# Patient Record
Sex: Female | Born: 1980 | Race: White | Hispanic: No | Smoking: Never smoker
Health system: Southern US, Community
[De-identification: ages and names within clinical notes are randomized; demographics above are authoritative.]

## PROBLEM LIST (undated history)

## (undated) DIAGNOSIS — K219 Gastro-esophageal reflux disease without esophagitis: Secondary | ICD-10-CM

## (undated) HISTORY — DX: Gastro-esophageal reflux disease without esophagitis: K21.9

---

## 2015-01-14 ENCOUNTER — Encounter: Payer: Self-pay | Admitting: Podiatry

## 2015-01-14 ENCOUNTER — Ambulatory Visit (INDEPENDENT_AMBULATORY_CARE_PROVIDER_SITE_OTHER): Payer: BLUE CROSS/BLUE SHIELD | Admitting: Podiatry

## 2015-01-14 ENCOUNTER — Ambulatory Visit (INDEPENDENT_AMBULATORY_CARE_PROVIDER_SITE_OTHER): Payer: BLUE CROSS/BLUE SHIELD

## 2015-01-14 VITALS — BP 101/61 | HR 74 | Resp 18

## 2015-01-14 DIAGNOSIS — M779 Enthesopathy, unspecified: Secondary | ICD-10-CM

## 2015-01-14 DIAGNOSIS — M7740 Metatarsalgia, unspecified foot: Secondary | ICD-10-CM

## 2015-01-14 DIAGNOSIS — M79673 Pain in unspecified foot: Secondary | ICD-10-CM

## 2015-01-14 NOTE — Progress Notes (Signed)
   Subjective:    Patient ID: Nicole Todd, female    DOB: Oct 16, 1980, 34 y.o.   MRN: [id number]  HPI  Patient presents the office today with complaints of bilateral second toe numbness and pain in the ball of her foot underlying second toe which has been ongoing for several months. She states that her mom has neuromas and she is concerned that she may be getting 1. She denies any history of injury or trauma. She denies any swelling or any redness. The pain does not wake up at night. Pain is worse in the morning. She said no prior treatment. No other complaints at this time.  Review of Systems  Neurological: Positive for headaches.  All other systems reviewed and are negative.      Objective:   Physical Exam AAO x3, NAD DP/PT pulses palpable bilaterally, CRT less than 3 seconds Protective sensation intact with Simms Weinstein monofilament, vibratory sensation intact, Achilles tendon reflex intact There is mild tenderness on Submetatarsal 2 bilaterally and there is a small hyperkeratotic lesion overlying submetatarsal 2. There is no pinpoint bony tenderness or pain the vibratory sensation. There is no tenderness along the second digit. There is no palpable neuroma identified in the interspaces and there is no reproduction of numbness or tingling upon palpation of the interspaces. There is no gross subluxation of the second digit compared to the contralateral extremities. No areas of tenderness to bilateral lower extremities. MMT 5/5, ROM WNL.  No open lesions or pre-ulcerative lesions.  No overlying edema, erythema, increase in warmth to bilateral lower extremities.  No pain with calf compression, swelling, warmth, erythema bilaterally.      Assessment & Plan:  Bilateral submetatarsal to pain, capsulitis, metatarsalgia -Treatment options discussed including all alternatives, risks, and complications -X-rays were obtained and reviewed with the patient.  -Likely etiology of her symptoms were  discussed -Offloading pads dispensed -discussed shoe gear modifications -Anti-inflammatories needed -Discussed possible orthotics. -Follow-up if symptoms are not resolved in 4-6 weeks or sooner if any problems arise. In the meantime, encouraged to call the office with any questions, concerns, change in symptoms.   Celesta Gentile, DPM

## 2015-02-14 ENCOUNTER — Encounter: Payer: Self-pay | Admitting: Podiatry

## 2015-02-14 ENCOUNTER — Ambulatory Visit (INDEPENDENT_AMBULATORY_CARE_PROVIDER_SITE_OTHER): Payer: BLUE CROSS/BLUE SHIELD | Admitting: Podiatry

## 2015-02-14 VITALS — BP 97/65 | HR 74 | Resp 18

## 2015-02-14 DIAGNOSIS — D3610 Benign neoplasm of peripheral nerves and autonomic nervous system, unspecified: Secondary | ICD-10-CM | POA: Diagnosis not present

## 2015-02-14 DIAGNOSIS — M7740 Metatarsalgia, unspecified foot: Secondary | ICD-10-CM | POA: Diagnosis not present

## 2015-02-14 DIAGNOSIS — M779 Enthesopathy, unspecified: Secondary | ICD-10-CM

## 2015-02-15 NOTE — Progress Notes (Signed)
Patient ID: Nicole Todd, female   DOB: [birth year], 33 y.o.   MRN: [id number]  Subjective: 34 year old female presents the office today follow-up evaluation of bilateral second toe pain as well as numbness of the second toe. She also states that she does get some occasional numbness the third toe as well bilaterally. She states the pads were dispensed possibly.help much but she did purchase new shoes in some other patches seem to help. Denies any swelling or redness. No recent injury or trauma. She says the pain is intermittent and not on the daily occurrence. No other complaints at this time.  Objective: AAO 3, NAD Neurovascular status intact and unchanged there is  Mild hammertoe contracture bilateral second digits. There is mild discomfort upon the second interspace bilaterally have there is no palpable neuroma identified. There is no pain with meals lateral compression of metatarsals. There is no area pinpoint bony tenderness or pain the vibratory sensation. MPJ range of motion is intact. MMT 5/5, ROM WNL. No open lesions or pre-ulcer lesions identified bilaterally. There is no pain with calf compression, swelling, warmth, erythema.  Assessment: 34 year old female with continued second toe pain bilaterally likely capsulitis versus possible neuroma  Plan: -Treatment options discussed including all alternatives, risks, and complications -I discussed steroid injection however patient wishes to hold off on that at this time. -Recommended anti-inflammatories. She said that she will tried over-the-counter before proceeding with prescription.  -Continue with offloading pads as well as supportive shoe gear. -Follow-up as needed. Call the office with any questions, concerns, changes symptoms.  Celesta Gentile, DPM

## 2016-05-04 DIAGNOSIS — Z6826 Body mass index (BMI) 26.0-26.9, adult: Secondary | ICD-10-CM | POA: Diagnosis not present

## 2016-05-04 DIAGNOSIS — Z01419 Encounter for gynecological examination (general) (routine) without abnormal findings: Secondary | ICD-10-CM | POA: Diagnosis not present

## 2016-05-11 DIAGNOSIS — J069 Acute upper respiratory infection, unspecified: Secondary | ICD-10-CM | POA: Diagnosis not present

## 2016-08-08 ENCOUNTER — Ambulatory Visit (INDEPENDENT_AMBULATORY_CARE_PROVIDER_SITE_OTHER): Payer: BLUE CROSS/BLUE SHIELD | Admitting: Podiatry

## 2016-08-08 DIAGNOSIS — D3610 Benign neoplasm of peripheral nerves and autonomic nervous system, unspecified: Secondary | ICD-10-CM | POA: Diagnosis not present

## 2016-08-08 DIAGNOSIS — M7740 Metatarsalgia, unspecified foot: Secondary | ICD-10-CM | POA: Diagnosis not present

## 2016-08-08 DIAGNOSIS — L853 Xerosis cutis: Secondary | ICD-10-CM

## 2016-08-13 ENCOUNTER — Telehealth: Payer: Self-pay | Admitting: *Deleted

## 2016-08-13 DIAGNOSIS — D3610 Benign neoplasm of peripheral nerves and autonomic nervous system, unspecified: Secondary | ICD-10-CM

## 2016-08-13 NOTE — Telephone Encounter (Addendum)
-----   Message from Trula Slade, DPM sent at 08/08/2016  4:07 PM EDT ----- Can you please order an ultrasound on the left foot on the 2nd and 3rd interspace to rule out neuroma. 08/13/2016-Faxed orders to [facility].

## 2016-08-13 NOTE — Progress Notes (Signed)
Subjective: 36 year old female presents the office today as her second toes continue to rub her big toe and she had developed a small callus to the side of the second toe. She also states that her skin is dry to the bottom of feet. She does continue to get some mild pain in between her toes mostly on the second and third interspaces. She states this is intermittent in nature but since been ongoing for some time. Denies any systemic complaints such as fevers, chills, nausea, vomiting. No acute changes since last appointment, and no other complaints at this time.   Objective: AAO x3, NAD DP/PT pulses palpable bilaterally, CRT less than 3 seconds Dry skin is presently but there is no fissuring or opening on the skin lesions. There is mild tenderness to the left foot on the second third interspaces. There does appear to be a palpable neuroma third interspace. She denies numbness and tingling to the toes today. There is no overlying swelling, erythema. This is chronic at this point. There is minimal hyperkeratotic tissue present along the distal interphalangeal joint medially on the second toe on the left side worse than right. There is no edema, erythema, increase in warmth. No open lesions or pre-ulcerative lesions.  No pain with calf compression, swelling, warmth, erythema  Assessment: Rule out neuroma left foot second third interspace, dry skin; warm  Plan: -All treatment options discussed with the patient including all alternatives, risks, complications.  -At this time given the chronicity of her symptoms and we'll order an ultrasound left foot to evaluate for neuroma to the second third interspaces. This is ordered today. -Discussed urea cream for dry skin.  -Offloading pads for the hammertoes. -A more definitive plan pending ultrasound results. Discussed injection therapy, orthotics, surgery. -RTC after ultrasound or sooner if needed.  -Patient encouraged to call the office with any questions,  concerns, change in symptoms.   Celesta Gentile, DPM

## 2016-08-17 DIAGNOSIS — J028 Acute pharyngitis due to other specified organisms: Secondary | ICD-10-CM | POA: Diagnosis not present

## 2016-08-21 DIAGNOSIS — L700 Acne vulgaris: Secondary | ICD-10-CM | POA: Diagnosis not present

## 2016-08-21 DIAGNOSIS — D225 Melanocytic nevi of trunk: Secondary | ICD-10-CM | POA: Diagnosis not present

## 2016-08-30 ENCOUNTER — Other Ambulatory Visit: Payer: Self-pay

## 2016-08-31 ENCOUNTER — Ambulatory Visit
Admission: RE | Admit: 2016-08-31 | Discharge: 2016-08-31 | Disposition: A | Discharge status: Home / Self Care | Payer: BLUE CROSS/BLUE SHIELD | Source: Ambulatory Visit | Attending: Podiatry | Admitting: Podiatry

## 2016-08-31 DIAGNOSIS — Z13220 Encounter for screening for lipoid disorders: Secondary | ICD-10-CM | POA: Diagnosis not present

## 2016-08-31 DIAGNOSIS — K219 Gastro-esophageal reflux disease without esophagitis: Secondary | ICD-10-CM | POA: Diagnosis not present

## 2016-08-31 DIAGNOSIS — Z131 Encounter for screening for diabetes mellitus: Secondary | ICD-10-CM | POA: Diagnosis not present

## 2016-08-31 DIAGNOSIS — M79672 Pain in left foot: Secondary | ICD-10-CM | POA: Diagnosis not present

## 2016-08-31 DIAGNOSIS — F900 Attention-deficit hyperactivity disorder, predominantly inattentive type: Secondary | ICD-10-CM | POA: Diagnosis not present

## 2016-08-31 DIAGNOSIS — B001 Herpesviral vesicular dermatitis: Secondary | ICD-10-CM | POA: Diagnosis not present

## 2016-09-13 ENCOUNTER — Ambulatory Visit (INDEPENDENT_AMBULATORY_CARE_PROVIDER_SITE_OTHER): Payer: BLUE CROSS/BLUE SHIELD | Admitting: Podiatry

## 2016-09-13 DIAGNOSIS — D3610 Benign neoplasm of peripheral nerves and autonomic nervous system, unspecified: Secondary | ICD-10-CM | POA: Diagnosis not present

## 2016-09-13 DIAGNOSIS — M7740 Metatarsalgia, unspecified foot: Secondary | ICD-10-CM | POA: Diagnosis not present

## 2016-09-13 NOTE — Progress Notes (Signed)
Subjective: 36 year old female presents the office to discuss ultrasound results. She states the pain to her left foot is doing better and she'll has intermittent pain and not consistent on a daily basis. She states that over the last several months is actually improved. She does get occasional numbness and tingling to the toes. Denies any systemic complaints such as fevers, chills, nausea, vomiting. No acute changes since last appointment, and no other complaints at this time.   Objective: AAO x3, NAD DP/PT pulses palpable bilaterally, CRT less than 3 seconds Mild to palpation second interspace as well as the third interspace and the left foot there is no significant palpable neuroma identified at this time there is no pain to palpation. There is no area pinpoint bony tenderness. Vibratory sensation. There is no overlying edema, erythema, increased warmth. There is prominence the metatarsal heads cleanly with atrophy of the fat pad.  No open lesions or pre-ulcerative lesions.  No pain with calf compression, swelling, warmth, erythema  Assessment: Neuroma left foot  Plan: -All treatment options discussed with the patient including all alternatives, risks, complications.  -Ultrasound results were discussed the patient. Discussed treatment options. We discussed surgical insert treatment. She does not desire surgery. Discussed with her steroid injection, dehydrated alcohol injections, physical therapy, orthotics. She wishes to hold off on any of these treatments. She does not want to do an injection and she will not wear an orthotic. -Patient encouraged to call the office with any questions, concerns, change in symptoms.   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  Celesta Gentile, DPM

## 2016-09-13 NOTE — Patient Instructions (Signed)
Morton Neuralgia Morton neuralgia is a type of foot pain in the area closest to your toes. This area is sometimes called the ball of your foot. Morton neuralgia occurs when a branch of a nerve in your foot (digital nerve) becomes compressed. When this happens over a long period of time, the nerve can thicken (neuroma) and cause pain. This usually occurs between the third and fourth toe. Morton neuralgia can come and go but may get worse over time. What are the causes? Your digital nerve can become compressed and stretched at a point where it passes under a thick band of tissue that connects your toes (intermetatarsal ligament). Morton neuralgia can be caused by mild repetitive damage in this area. This type of damage can result from:  Activities such as running or jumping.  Wearing shoes that are too tight. What increases the risk? You may be at risk for Morton neuralgia if you:  Are female.  Wear high heels.  Wear shoes that are narrow or tight.  Participate in activities that stretch your toes. These include:  Running.  Ballet.  Long-distance walking. What are the signs or symptoms? The first symptom of Morton neuralgia is pain that spreads from the ball of your foot to your toes. It may feel like you are walking on a marble. Pain usually gets worse with walking and goes away at night. Other symptoms may include numbness and cramping of your toes. How is this diagnosed? Your health care provider will do a physical exam. When doing the exam, your health care provider may:  Squeeze your foot just behind your toe.  Ask you to move your toes to check for pain. You may also have tests on your foot to confirm the diagnosis. These may include:  An X-ray.  An MRI. How is this treated? Treatment for Morton neuralgia may be as simple as changing the kind of shoes you wear. Other treatments may include:  Wearing a supportive pad (orthosis) under the front of your foot. This lifts your  toe bones and takes pressure off the nerve.  Getting injections of numbing medicine and anti-inflammatory medicine (steroid) in the nerve.  Having surgery to remove part of the thickened nerve. Follow these instructions at home:  Take medicine only as directed by your health care provider.  Wear soft-soled shoes with a wide toe area.  Stop activities that may be causing pain.  Elevate your foot when resting.  Massage your foot.  Apply ice to the injured area:  Put ice in a plastic bag.  Place a towel between your skin and the bag.  Leave the ice on for 20 minutes, 2-3 times a day.  Keep all follow-up visits as directed by your health care provider. This is important. Contact a health care provider if:  Home care instructions are not helping you get better.  Your symptoms change or get worse. This information is not intended to replace advice given to you by your health care provider. Make sure you discuss any questions you have with your health care provider. Document Released: 08/13/2000 Document Revised: 10/13/2015 Document Reviewed: 07/08/2013 Elsevier Interactive Patient Education  2017 [employer].

## 2016-09-21 DIAGNOSIS — D2211 Melanocytic nevi of right eyelid, including canthus: Secondary | ICD-10-CM | POA: Diagnosis not present

## 2016-10-10 DIAGNOSIS — Z3009 Encounter for other general counseling and advice on contraception: Secondary | ICD-10-CM | POA: Diagnosis not present

## 2016-10-18 DIAGNOSIS — L2089 Other atopic dermatitis: Secondary | ICD-10-CM | POA: Diagnosis not present

## 2016-10-18 DIAGNOSIS — L700 Acne vulgaris: Secondary | ICD-10-CM | POA: Diagnosis not present

## 2016-10-23 DIAGNOSIS — D2262 Melanocytic nevi of left upper limb, including shoulder: Secondary | ICD-10-CM | POA: Diagnosis not present

## 2016-10-23 DIAGNOSIS — D225 Melanocytic nevi of trunk: Secondary | ICD-10-CM | POA: Diagnosis not present

## 2016-10-23 DIAGNOSIS — L853 Xerosis cutis: Secondary | ICD-10-CM | POA: Diagnosis not present

## 2016-10-23 DIAGNOSIS — D2261 Melanocytic nevi of right upper limb, including shoulder: Secondary | ICD-10-CM | POA: Diagnosis not present

## 2016-10-23 DIAGNOSIS — D485 Neoplasm of uncertain behavior of skin: Secondary | ICD-10-CM | POA: Diagnosis not present

## 2016-10-23 DIAGNOSIS — L2089 Other atopic dermatitis: Secondary | ICD-10-CM | POA: Diagnosis not present

## 2016-12-13 DIAGNOSIS — Z113 Encounter for screening for infections with a predominantly sexual mode of transmission: Secondary | ICD-10-CM | POA: Diagnosis not present

## 2016-12-17 DIAGNOSIS — Z1159 Encounter for screening for other viral diseases: Secondary | ICD-10-CM | POA: Diagnosis not present

## 2016-12-17 DIAGNOSIS — Z113 Encounter for screening for infections with a predominantly sexual mode of transmission: Secondary | ICD-10-CM | POA: Diagnosis not present

## 2016-12-17 DIAGNOSIS — Z114 Encounter for screening for human immunodeficiency virus [HIV]: Secondary | ICD-10-CM | POA: Diagnosis not present

## 2016-12-17 DIAGNOSIS — N882 Stricture and stenosis of cervix uteri: Secondary | ICD-10-CM | POA: Diagnosis not present

## 2016-12-17 DIAGNOSIS — Z3202 Encounter for pregnancy test, result negative: Secondary | ICD-10-CM | POA: Diagnosis not present

## 2016-12-17 DIAGNOSIS — Z30430 Encounter for insertion of intrauterine contraceptive device: Secondary | ICD-10-CM | POA: Diagnosis not present

## 2017-01-17 DIAGNOSIS — Z30431 Encounter for routine checking of intrauterine contraceptive device: Secondary | ICD-10-CM | POA: Diagnosis not present

## 2017-03-01 DIAGNOSIS — F900 Attention-deficit hyperactivity disorder, predominantly inattentive type: Secondary | ICD-10-CM | POA: Diagnosis not present

## 2017-03-01 DIAGNOSIS — B001 Herpesviral vesicular dermatitis: Secondary | ICD-10-CM | POA: Diagnosis not present

## 2017-03-01 DIAGNOSIS — K219 Gastro-esophageal reflux disease without esophagitis: Secondary | ICD-10-CM | POA: Diagnosis not present

## 2017-04-17 DIAGNOSIS — J029 Acute pharyngitis, unspecified: Secondary | ICD-10-CM | POA: Diagnosis not present

## 2017-05-10 DIAGNOSIS — Z01419 Encounter for gynecological examination (general) (routine) without abnormal findings: Secondary | ICD-10-CM | POA: Diagnosis not present

## 2017-05-10 DIAGNOSIS — Z1151 Encounter for screening for human papillomavirus (HPV): Secondary | ICD-10-CM | POA: Diagnosis not present

## 2017-05-10 DIAGNOSIS — N939 Abnormal uterine and vaginal bleeding, unspecified: Secondary | ICD-10-CM | POA: Diagnosis not present

## 2017-05-10 DIAGNOSIS — Z6824 Body mass index (BMI) 24.0-24.9, adult: Secondary | ICD-10-CM | POA: Diagnosis not present

## 2017-07-11 DIAGNOSIS — N939 Abnormal uterine and vaginal bleeding, unspecified: Secondary | ICD-10-CM | POA: Diagnosis not present

## 2017-08-24 DIAGNOSIS — R300 Dysuria: Secondary | ICD-10-CM | POA: Diagnosis not present

## 2017-08-24 DIAGNOSIS — R1084 Generalized abdominal pain: Secondary | ICD-10-CM | POA: Diagnosis not present

## 2017-08-24 DIAGNOSIS — N911 Secondary amenorrhea: Secondary | ICD-10-CM | POA: Diagnosis not present

## 2017-08-24 DIAGNOSIS — N390 Urinary tract infection, site not specified: Secondary | ICD-10-CM | POA: Diagnosis not present

## 2017-08-24 DIAGNOSIS — R82998 Other abnormal findings in urine: Secondary | ICD-10-CM | POA: Diagnosis not present

## 2017-09-04 DIAGNOSIS — B001 Herpesviral vesicular dermatitis: Secondary | ICD-10-CM | POA: Diagnosis not present

## 2017-09-04 DIAGNOSIS — F900 Attention-deficit hyperactivity disorder, predominantly inattentive type: Secondary | ICD-10-CM | POA: Diagnosis not present

## 2017-09-04 DIAGNOSIS — K219 Gastro-esophageal reflux disease without esophagitis: Secondary | ICD-10-CM | POA: Diagnosis not present

## 2017-09-04 DIAGNOSIS — L309 Dermatitis, unspecified: Secondary | ICD-10-CM | POA: Diagnosis not present

## 2017-09-05 DIAGNOSIS — R1084 Generalized abdominal pain: Secondary | ICD-10-CM | POA: Diagnosis not present

## 2017-09-05 DIAGNOSIS — R3129 Other microscopic hematuria: Secondary | ICD-10-CM | POA: Diagnosis not present

## 2017-09-10 DIAGNOSIS — R300 Dysuria: Secondary | ICD-10-CM | POA: Diagnosis not present

## 2017-09-10 DIAGNOSIS — R109 Unspecified abdominal pain: Secondary | ICD-10-CM | POA: Diagnosis not present

## 2017-09-10 DIAGNOSIS — R3129 Other microscopic hematuria: Secondary | ICD-10-CM | POA: Diagnosis not present

## 2017-09-16 DIAGNOSIS — D22111 Melanocytic nevi of right upper eyelid, including canthus: Secondary | ICD-10-CM | POA: Diagnosis not present

## 2017-10-29 DIAGNOSIS — N911 Secondary amenorrhea: Secondary | ICD-10-CM | POA: Diagnosis not present

## 2017-10-29 DIAGNOSIS — R3129 Other microscopic hematuria: Secondary | ICD-10-CM | POA: Diagnosis not present

## 2017-10-29 DIAGNOSIS — R300 Dysuria: Secondary | ICD-10-CM | POA: Diagnosis not present

## 2017-12-06 DIAGNOSIS — Z1159 Encounter for screening for other viral diseases: Secondary | ICD-10-CM | POA: Diagnosis not present

## 2017-12-06 DIAGNOSIS — B373 Candidiasis of vulva and vagina: Secondary | ICD-10-CM | POA: Diagnosis not present

## 2017-12-06 DIAGNOSIS — L292 Pruritus vulvae: Secondary | ICD-10-CM | POA: Diagnosis not present

## 2017-12-06 DIAGNOSIS — Z114 Encounter for screening for human immunodeficiency virus [HIV]: Secondary | ICD-10-CM | POA: Diagnosis not present

## 2017-12-06 DIAGNOSIS — N898 Other specified noninflammatory disorders of vagina: Secondary | ICD-10-CM | POA: Diagnosis not present

## 2017-12-06 DIAGNOSIS — Z113 Encounter for screening for infections with a predominantly sexual mode of transmission: Secondary | ICD-10-CM | POA: Diagnosis not present

## 2017-12-06 DIAGNOSIS — Z118 Encounter for screening for other infectious and parasitic diseases: Secondary | ICD-10-CM | POA: Diagnosis not present

## 2018-03-07 DIAGNOSIS — K219 Gastro-esophageal reflux disease without esophagitis: Secondary | ICD-10-CM | POA: Diagnosis not present

## 2018-03-07 DIAGNOSIS — B001 Herpesviral vesicular dermatitis: Secondary | ICD-10-CM | POA: Diagnosis not present

## 2018-03-07 DIAGNOSIS — F900 Attention-deficit hyperactivity disorder, predominantly inattentive type: Secondary | ICD-10-CM | POA: Diagnosis not present

## 2018-04-30 DIAGNOSIS — Z1231 Encounter for screening mammogram for malignant neoplasm of breast: Secondary | ICD-10-CM | POA: Diagnosis not present

## 2018-05-27 DIAGNOSIS — Z130 Encounter for screening for diseases of the blood and blood-forming organs and certain disorders involving the immune mechanism: Secondary | ICD-10-CM | POA: Diagnosis not present

## 2018-05-27 DIAGNOSIS — Z1329 Encounter for screening for other suspected endocrine disorder: Secondary | ICD-10-CM | POA: Diagnosis not present

## 2018-05-27 DIAGNOSIS — Z6825 Body mass index (BMI) 25.0-25.9, adult: Secondary | ICD-10-CM | POA: Diagnosis not present

## 2018-05-27 DIAGNOSIS — Z118 Encounter for screening for other infectious and parasitic diseases: Secondary | ICD-10-CM | POA: Diagnosis not present

## 2018-05-27 DIAGNOSIS — Z113 Encounter for screening for infections with a predominantly sexual mode of transmission: Secondary | ICD-10-CM | POA: Diagnosis not present

## 2018-05-27 DIAGNOSIS — Z0000 Encounter for general adult medical examination without abnormal findings: Secondary | ICD-10-CM | POA: Diagnosis not present

## 2018-05-27 DIAGNOSIS — Z1159 Encounter for screening for other viral diseases: Secondary | ICD-10-CM | POA: Diagnosis not present

## 2018-05-27 DIAGNOSIS — Z114 Encounter for screening for human immunodeficiency virus [HIV]: Secondary | ICD-10-CM | POA: Diagnosis not present

## 2018-05-27 DIAGNOSIS — Z01419 Encounter for gynecological examination (general) (routine) without abnormal findings: Secondary | ICD-10-CM | POA: Diagnosis not present

## 2018-06-17 DIAGNOSIS — H52202 Unspecified astigmatism, left eye: Secondary | ICD-10-CM | POA: Diagnosis not present

## 2018-06-17 DIAGNOSIS — D22111 Melanocytic nevi of right upper eyelid, including canthus: Secondary | ICD-10-CM | POA: Diagnosis not present

## 2018-07-01 DIAGNOSIS — J069 Acute upper respiratory infection, unspecified: Secondary | ICD-10-CM | POA: Diagnosis not present

## 2018-07-01 DIAGNOSIS — J101 Influenza due to other identified influenza virus with other respiratory manifestations: Secondary | ICD-10-CM | POA: Diagnosis not present

## 2018-07-14 DIAGNOSIS — L700 Acne vulgaris: Secondary | ICD-10-CM | POA: Diagnosis not present

## 2018-09-08 DIAGNOSIS — F900 Attention-deficit hyperactivity disorder, predominantly inattentive type: Secondary | ICD-10-CM | POA: Diagnosis not present

## 2018-09-08 DIAGNOSIS — K219 Gastro-esophageal reflux disease without esophagitis: Secondary | ICD-10-CM | POA: Diagnosis not present

## 2018-09-08 DIAGNOSIS — B001 Herpesviral vesicular dermatitis: Secondary | ICD-10-CM | POA: Diagnosis not present

## 2018-09-26 DIAGNOSIS — J069 Acute upper respiratory infection, unspecified: Secondary | ICD-10-CM | POA: Diagnosis not present

## 2018-11-12 DIAGNOSIS — L700 Acne vulgaris: Secondary | ICD-10-CM | POA: Diagnosis not present

## 2018-11-12 DIAGNOSIS — Z79899 Other long term (current) drug therapy: Secondary | ICD-10-CM | POA: Diagnosis not present

## 2019-01-06 ENCOUNTER — Other Ambulatory Visit: Payer: Self-pay | Admitting: Family Medicine

## 2019-01-06 DIAGNOSIS — R109 Unspecified abdominal pain: Secondary | ICD-10-CM | POA: Diagnosis not present

## 2019-01-09 ENCOUNTER — Other Ambulatory Visit: Payer: Self-pay | Admitting: Family Medicine

## 2019-01-09 DIAGNOSIS — R1030 Lower abdominal pain, unspecified: Secondary | ICD-10-CM

## 2019-01-12 ENCOUNTER — Ambulatory Visit
Admission: RE | Admit: 2019-01-12 | Discharge: 2019-01-12 | Disposition: A | Discharge status: Home / Self Care | Payer: BC Managed Care – PPO | Source: Ambulatory Visit | Attending: Family Medicine | Admitting: Family Medicine

## 2019-01-12 DIAGNOSIS — R1010 Upper abdominal pain, unspecified: Secondary | ICD-10-CM | POA: Diagnosis not present

## 2019-01-12 DIAGNOSIS — R109 Unspecified abdominal pain: Secondary | ICD-10-CM

## 2019-01-13 ENCOUNTER — Ambulatory Visit
Admission: RE | Admit: 2019-01-13 | Discharge: 2019-01-13 | Disposition: A | Discharge status: Home / Self Care | Payer: BC Managed Care – PPO | Source: Ambulatory Visit | Attending: Family Medicine | Admitting: Family Medicine

## 2019-01-13 DIAGNOSIS — R1030 Lower abdominal pain, unspecified: Secondary | ICD-10-CM

## 2019-03-11 DIAGNOSIS — Z23 Encounter for immunization: Secondary | ICD-10-CM | POA: Diagnosis not present

## 2019-03-11 DIAGNOSIS — K219 Gastro-esophageal reflux disease without esophagitis: Secondary | ICD-10-CM | POA: Diagnosis not present

## 2019-03-11 DIAGNOSIS — B001 Herpesviral vesicular dermatitis: Secondary | ICD-10-CM | POA: Diagnosis not present

## 2019-03-11 DIAGNOSIS — F900 Attention-deficit hyperactivity disorder, predominantly inattentive type: Secondary | ICD-10-CM | POA: Diagnosis not present

## 2019-03-25 DIAGNOSIS — L700 Acne vulgaris: Secondary | ICD-10-CM | POA: Diagnosis not present

## 2019-04-10 ENCOUNTER — Other Ambulatory Visit: Payer: Self-pay

## 2019-04-10 DIAGNOSIS — Z20822 Contact with and (suspected) exposure to covid-19: Secondary | ICD-10-CM

## 2019-04-13 LAB — NOVEL CORONAVIRUS, NAA: SARS-CoV-2, NAA: NOT DETECTED

## 2019-06-02 DIAGNOSIS — Z01419 Encounter for gynecological examination (general) (routine) without abnormal findings: Secondary | ICD-10-CM | POA: Diagnosis not present

## 2019-06-02 DIAGNOSIS — Z6826 Body mass index (BMI) 26.0-26.9, adult: Secondary | ICD-10-CM | POA: Diagnosis not present

## 2019-07-01 DIAGNOSIS — D22111 Melanocytic nevi of right upper eyelid, including canthus: Secondary | ICD-10-CM | POA: Diagnosis not present

## 2019-07-23 DIAGNOSIS — L821 Other seborrheic keratosis: Secondary | ICD-10-CM | POA: Diagnosis not present

## 2019-07-23 DIAGNOSIS — D2239 Melanocytic nevi of other parts of face: Secondary | ICD-10-CM | POA: Diagnosis not present

## 2019-07-24 ENCOUNTER — Ambulatory Visit: Payer: BC Managed Care – PPO

## 2019-07-30 ENCOUNTER — Ambulatory Visit: Payer: BC Managed Care – PPO | Attending: Internal Medicine

## 2019-07-30 DIAGNOSIS — Z23 Encounter for immunization: Secondary | ICD-10-CM

## 2019-07-30 NOTE — Progress Notes (Signed)
   Covid-19 Vaccination Clinic  Name:  Abisola Pfeffer    MRN: [id number] DOB: Oct 14, 1980  07/30/2019  Ms. Sobolewski was observed post Covid-19 immunization for 15 minutes without incident. She was provided with Vaccine Information Sheet and instruction to access the V-Safe system.   Ms. Freeberg was instructed to call 911 with any severe reactions post vaccine: Marland Kitchen Difficulty breathing  . Swelling of face and throat  . A fast heartbeat  . A bad rash all over body  . Dizziness and weakness   Immunizations Administered    Name Date Dose VIS Date Route   Pfizer COVID-19 Vaccine 07/30/2019  9:57 AM 0.3 mL 05/01/2019 Intramuscular   Manufacturer: [place]   Lot: [id number]   [place]: [id number]

## 2019-08-24 ENCOUNTER — Ambulatory Visit: Payer: BC Managed Care – PPO | Attending: Internal Medicine

## 2019-08-24 DIAGNOSIS — Z23 Encounter for immunization: Secondary | ICD-10-CM

## 2019-08-24 NOTE — Progress Notes (Signed)
   Covid-19 Vaccination Clinic  Name:  Nicole Todd    MRN: [id number] DOB: Jan 12, 1981  08/24/2019  [name] was observed post Covid-19 immunization for 15 minutes without incident. She was provided with Vaccine Information Sheet and instruction to access the V-Safe system.   [name] was instructed to call 911 with any severe reactions post vaccine: Marland Kitchen Difficulty breathing  . Swelling of face and throat  . A fast heartbeat  . A bad rash all over body  . Dizziness and weakness   Immunizations Administered    Name Date Dose VIS Date Route   Pfizer COVID-19 Vaccine 08/24/2019  5:09 PM 0.3 mL 05/01/2019 Intramuscular   Manufacturer: [place]   Lot: [id number]   [place]: [id number]

## 2019-09-15 DIAGNOSIS — B001 Herpesviral vesicular dermatitis: Secondary | ICD-10-CM | POA: Diagnosis not present

## 2019-09-15 DIAGNOSIS — K219 Gastro-esophageal reflux disease without esophagitis: Secondary | ICD-10-CM | POA: Diagnosis not present

## 2019-09-15 DIAGNOSIS — F900 Attention-deficit hyperactivity disorder, predominantly inattentive type: Secondary | ICD-10-CM | POA: Diagnosis not present

## 2019-09-15 DIAGNOSIS — R635 Abnormal weight gain: Secondary | ICD-10-CM | POA: Diagnosis not present

## 2020-03-29 DIAGNOSIS — F900 Attention-deficit hyperactivity disorder, predominantly inattentive type: Secondary | ICD-10-CM | POA: Diagnosis not present

## 2020-03-29 DIAGNOSIS — B001 Herpesviral vesicular dermatitis: Secondary | ICD-10-CM | POA: Diagnosis not present

## 2020-03-29 DIAGNOSIS — K219 Gastro-esophageal reflux disease without esophagitis: Secondary | ICD-10-CM | POA: Diagnosis not present

## 2020-03-29 DIAGNOSIS — F439 Reaction to severe stress, unspecified: Secondary | ICD-10-CM | POA: Diagnosis not present

## 2020-04-06 DIAGNOSIS — Z1231 Encounter for screening mammogram for malignant neoplasm of breast: Secondary | ICD-10-CM | POA: Diagnosis not present

## 2020-05-18 IMAGING — US US PELVIS COMPLETE
1 series · 14 of 25 positions shown · non-contrast
Comparison: None

CLINICAL DATA: Lower abdominal pain of unknown etiology, IUD

EXAM:
TRANSABDOMINAL ULTRASOUND OF PELVIS
TECHNIQUE: Transabdominal ultrasound examination of the pelvis was performed
including evaluation of the uterus, ovaries, adnexal regions, and
pelvic cul-de-sac.

[Series 1: us pelvis complete · 0.20mm/px · 14 of 40 slices shown]
[im 1/40]
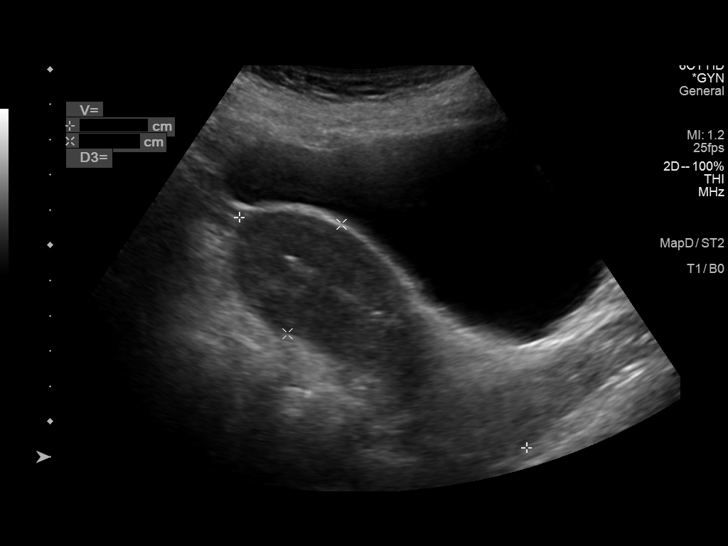
[im 4/40]
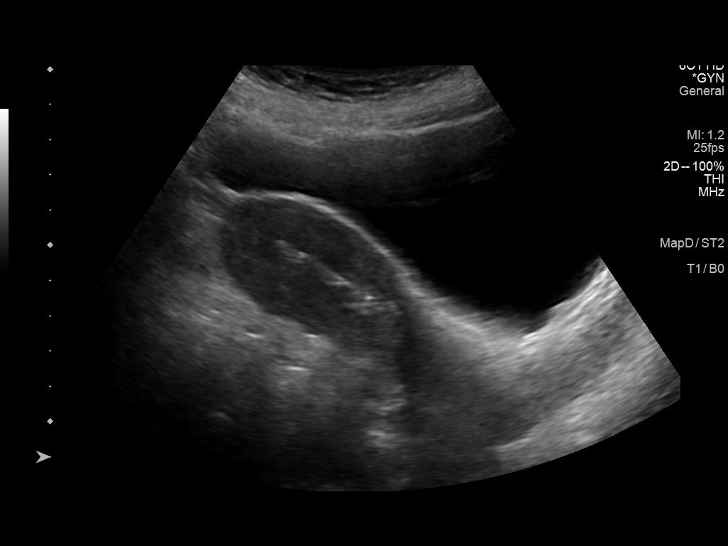
[im 7/40]
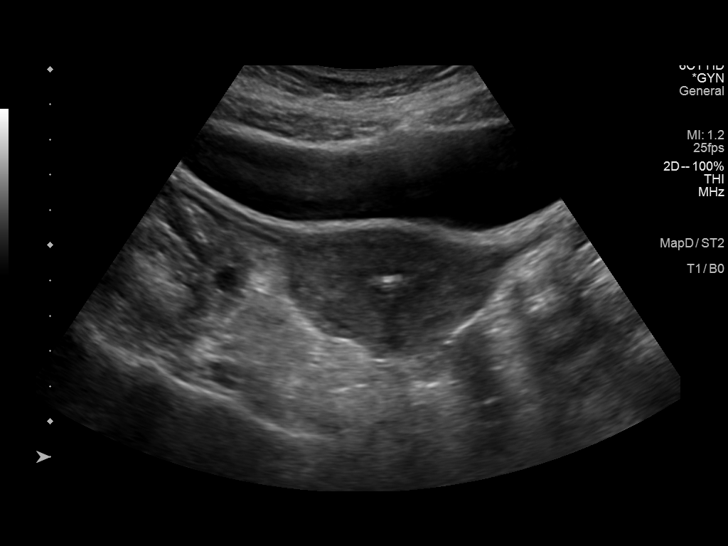
[im 10/40]
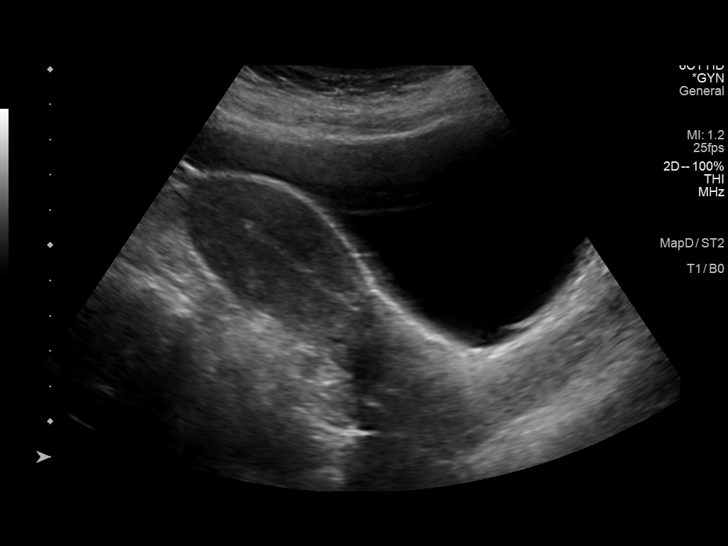
[im 14/40]
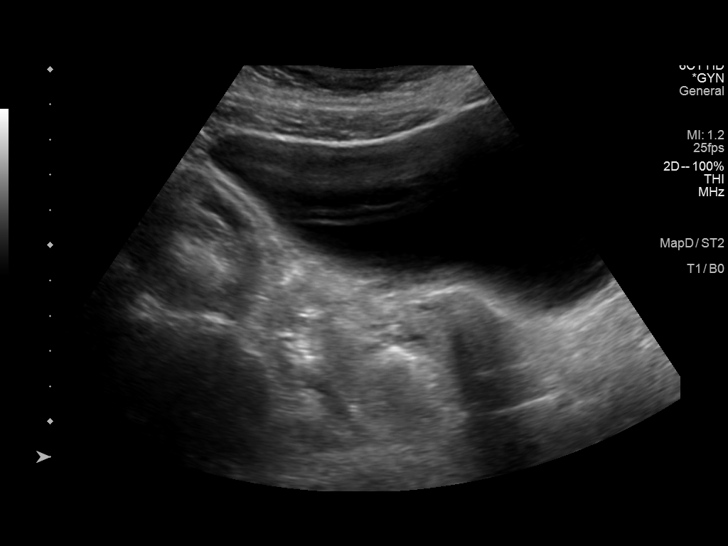
[im 15/40]
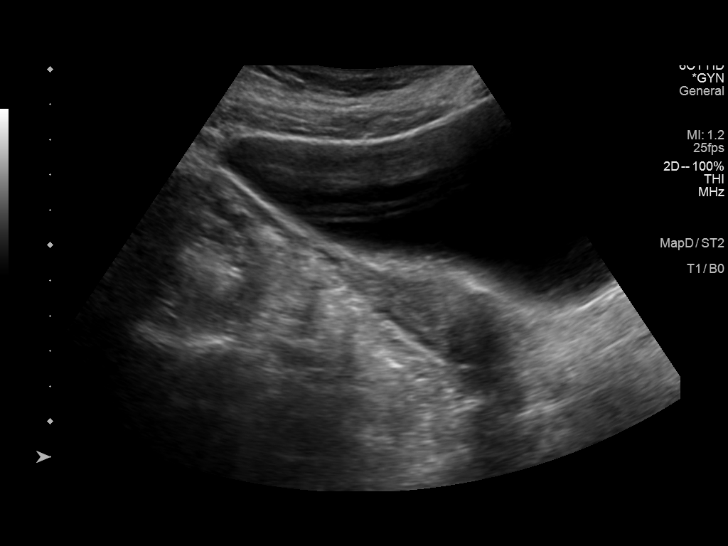
[im 18/40]
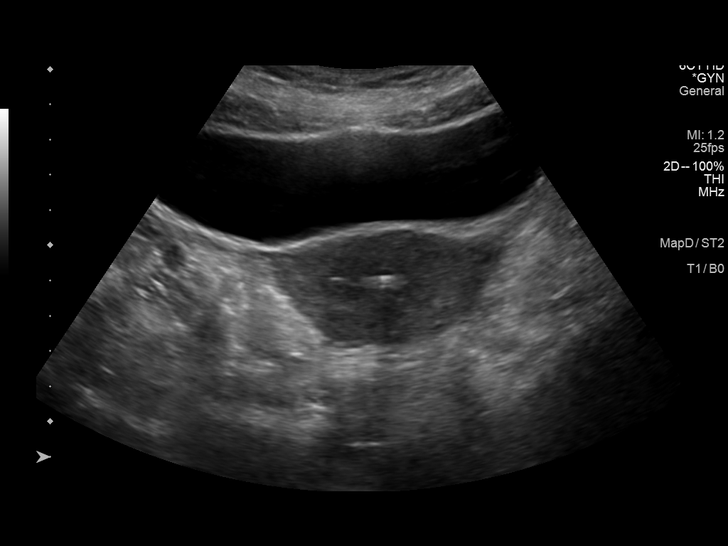
[im 22/40]
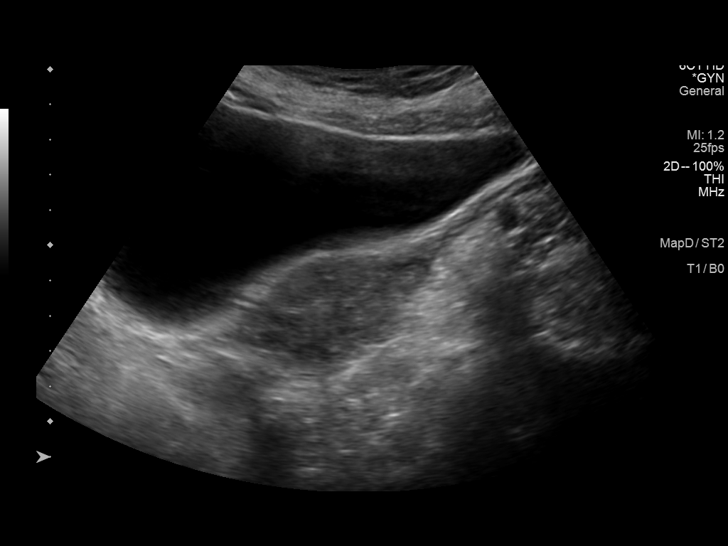
[im 25/40]
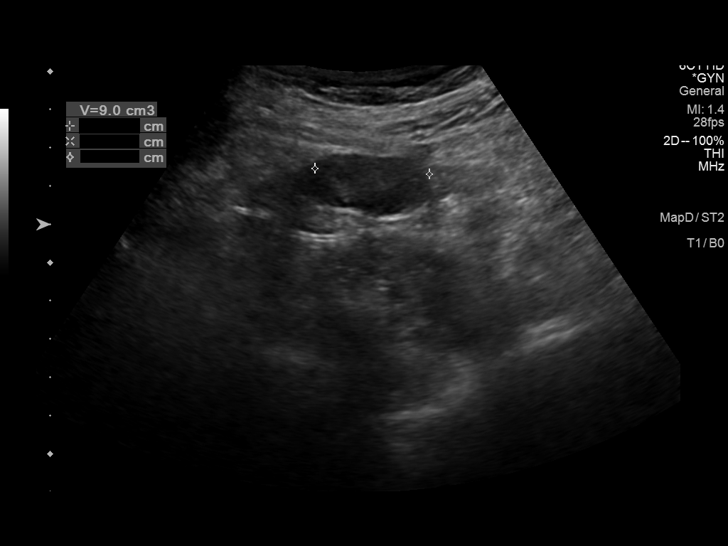
[im 27/40]
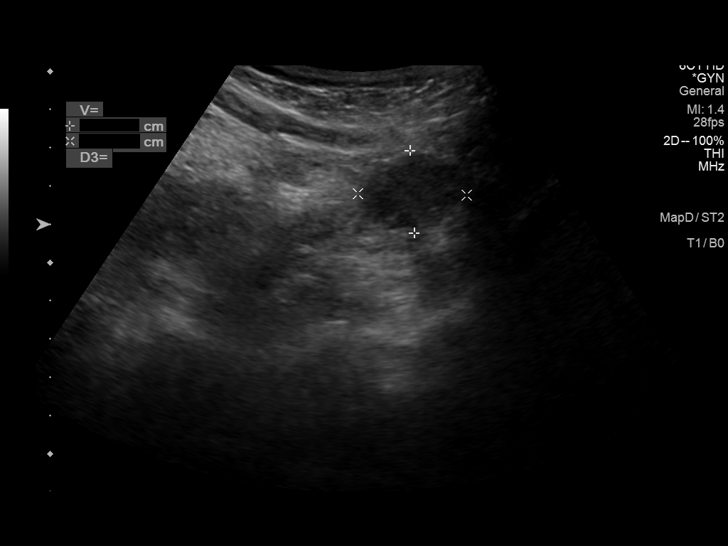
[im 30/40]
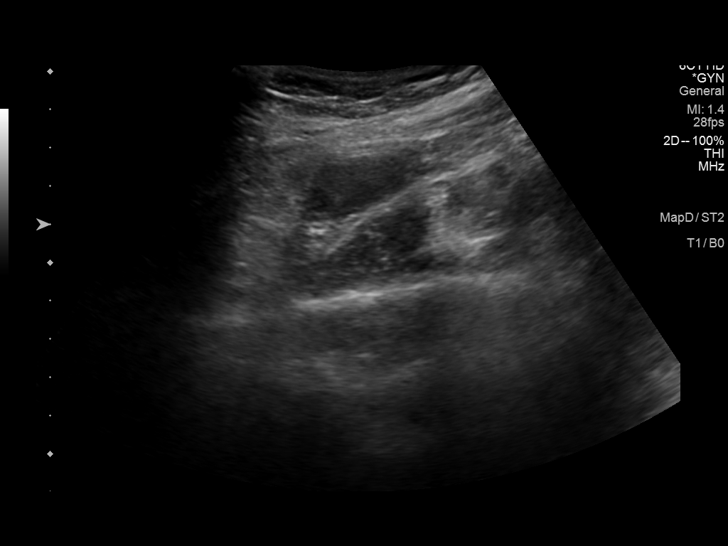
[im 33/40]
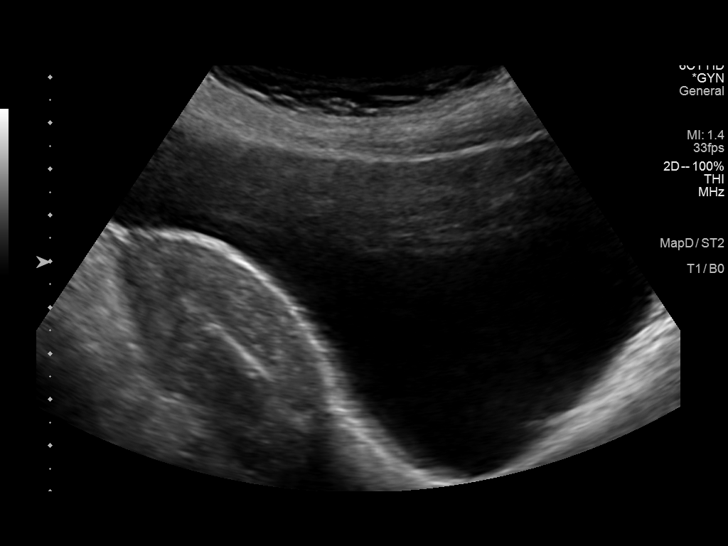
[im 36/40]
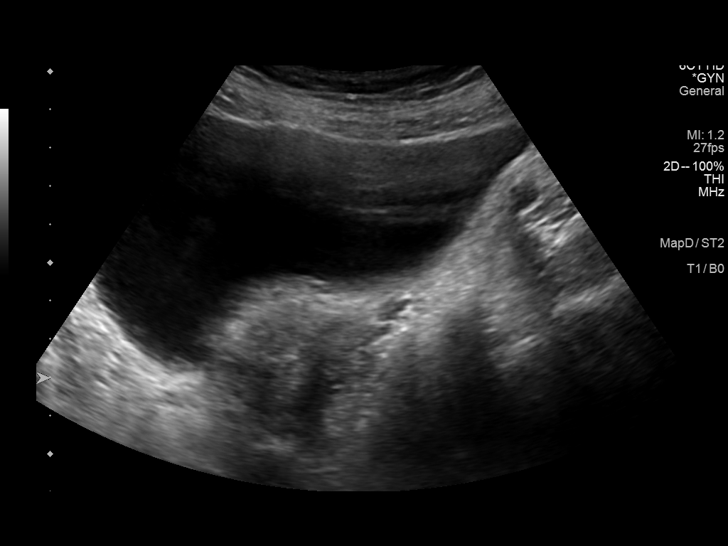
[im 40/40]
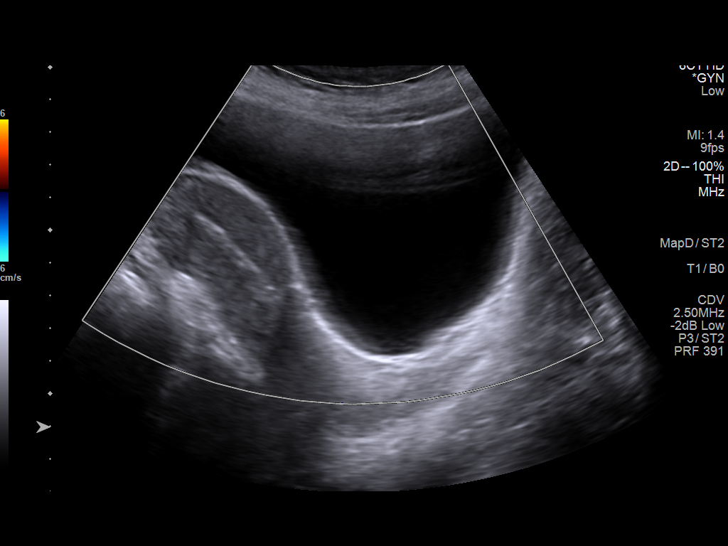

[14 of 25 positions shown; findings below may reference images not displayed]

FINDINGS: Uterus

Measurements: 10.4 x 3.5 x 4.8 cm = volume: 90 mL. Anteverted.
Anterior wall Caesarean section scar. Otherwise normal morphology
without mass period

Endometrium

Thickness: 7 mm. IUD within expected position at the upper uterus.
No endometrial fluid.

Right ovary

Measurements: 3.0 x 1.5 x 3.7 cm = volume: 9.0 mL. Normal morphology
without mass

Left ovary

Measurements: 3.6 x 2.2 x 2.8 cm = volume: 11.6 mL. Normal
morphology without mass

Other findings:  No free pelvic fluid.  No adnexal masses.
IMPRESSION: IUD within expected position at the upper uterus.

Otherwise normal exam.

## 2020-05-19 DIAGNOSIS — F439 Reaction to severe stress, unspecified: Secondary | ICD-10-CM | POA: Diagnosis not present

## 2020-05-19 DIAGNOSIS — J3489 Other specified disorders of nose and nasal sinuses: Secondary | ICD-10-CM | POA: Diagnosis not present

## 2020-05-23 DIAGNOSIS — Z8371 Family history of colonic polyps: Secondary | ICD-10-CM | POA: Diagnosis not present

## 2020-05-23 DIAGNOSIS — K921 Melena: Secondary | ICD-10-CM | POA: Diagnosis not present

## 2020-05-23 DIAGNOSIS — Z800 Family history of malignant neoplasm of digestive organs: Secondary | ICD-10-CM | POA: Diagnosis not present

## 2020-07-12 DIAGNOSIS — D22111 Melanocytic nevi of right upper eyelid, including canthus: Secondary | ICD-10-CM | POA: Diagnosis not present

## 2020-07-12 DIAGNOSIS — Z01419 Encounter for gynecological examination (general) (routine) without abnormal findings: Secondary | ICD-10-CM | POA: Diagnosis not present

## 2020-07-12 DIAGNOSIS — H5213 Myopia, bilateral: Secondary | ICD-10-CM | POA: Diagnosis not present

## 2020-07-12 DIAGNOSIS — Z6828 Body mass index (BMI) 28.0-28.9, adult: Secondary | ICD-10-CM | POA: Diagnosis not present

## 2020-09-26 DIAGNOSIS — F900 Attention-deficit hyperactivity disorder, predominantly inattentive type: Secondary | ICD-10-CM | POA: Diagnosis not present

## 2020-09-26 DIAGNOSIS — F439 Reaction to severe stress, unspecified: Secondary | ICD-10-CM | POA: Diagnosis not present

## 2020-09-26 DIAGNOSIS — B001 Herpesviral vesicular dermatitis: Secondary | ICD-10-CM | POA: Diagnosis not present

## 2020-09-26 DIAGNOSIS — K219 Gastro-esophageal reflux disease without esophagitis: Secondary | ICD-10-CM | POA: Diagnosis not present

## 2020-09-29 DIAGNOSIS — J069 Acute upper respiratory infection, unspecified: Secondary | ICD-10-CM | POA: Diagnosis not present

## 2020-09-29 DIAGNOSIS — Z03818 Encounter for observation for suspected exposure to other biological agents ruled out: Secondary | ICD-10-CM | POA: Diagnosis not present

## 2020-12-07 DIAGNOSIS — M7541 Impingement syndrome of right shoulder: Secondary | ICD-10-CM | POA: Diagnosis not present

## 2021-02-06 DIAGNOSIS — L700 Acne vulgaris: Secondary | ICD-10-CM | POA: Diagnosis not present

## 2021-02-09 DIAGNOSIS — U071 COVID-19: Secondary | ICD-10-CM | POA: Diagnosis not present

## 2021-04-12 DIAGNOSIS — F4323 Adjustment disorder with mixed anxiety and depressed mood: Secondary | ICD-10-CM | POA: Diagnosis not present

## 2021-04-18 DIAGNOSIS — F900 Attention-deficit hyperactivity disorder, predominantly inattentive type: Secondary | ICD-10-CM | POA: Diagnosis not present

## 2021-04-18 DIAGNOSIS — B001 Herpesviral vesicular dermatitis: Secondary | ICD-10-CM | POA: Diagnosis not present

## 2021-04-18 DIAGNOSIS — K219 Gastro-esophageal reflux disease without esophagitis: Secondary | ICD-10-CM | POA: Diagnosis not present

## 2021-04-18 DIAGNOSIS — F439 Reaction to severe stress, unspecified: Secondary | ICD-10-CM | POA: Diagnosis not present

## 2021-04-19 DIAGNOSIS — F4323 Adjustment disorder with mixed anxiety and depressed mood: Secondary | ICD-10-CM | POA: Diagnosis not present

## 2021-04-26 DIAGNOSIS — F4323 Adjustment disorder with mixed anxiety and depressed mood: Secondary | ICD-10-CM | POA: Diagnosis not present

## 2021-05-22 DIAGNOSIS — F4323 Adjustment disorder with mixed anxiety and depressed mood: Secondary | ICD-10-CM | POA: Diagnosis not present

## 2021-05-31 DIAGNOSIS — F4323 Adjustment disorder with mixed anxiety and depressed mood: Secondary | ICD-10-CM | POA: Diagnosis not present

## 2021-06-13 DIAGNOSIS — F4323 Adjustment disorder with mixed anxiety and depressed mood: Secondary | ICD-10-CM | POA: Diagnosis not present

## 2021-07-03 DIAGNOSIS — F4323 Adjustment disorder with mixed anxiety and depressed mood: Secondary | ICD-10-CM | POA: Diagnosis not present

## 2021-07-13 DIAGNOSIS — Z01419 Encounter for gynecological examination (general) (routine) without abnormal findings: Secondary | ICD-10-CM | POA: Diagnosis not present

## 2021-07-13 DIAGNOSIS — Z124 Encounter for screening for malignant neoplasm of cervix: Secondary | ICD-10-CM | POA: Diagnosis not present

## 2021-07-13 DIAGNOSIS — Z1151 Encounter for screening for human papillomavirus (HPV): Secondary | ICD-10-CM | POA: Diagnosis not present

## 2021-07-13 DIAGNOSIS — Z6829 Body mass index (BMI) 29.0-29.9, adult: Secondary | ICD-10-CM | POA: Diagnosis not present

## 2021-07-19 DIAGNOSIS — Z1231 Encounter for screening mammogram for malignant neoplasm of breast: Secondary | ICD-10-CM | POA: Diagnosis not present

## 2021-07-31 DIAGNOSIS — H52203 Unspecified astigmatism, bilateral: Secondary | ICD-10-CM | POA: Diagnosis not present

## 2021-07-31 DIAGNOSIS — D23111 Other benign neoplasm of skin of right upper eyelid, including canthus: Secondary | ICD-10-CM | POA: Diagnosis not present

## 2021-07-31 DIAGNOSIS — H5213 Myopia, bilateral: Secondary | ICD-10-CM | POA: Diagnosis not present

## 2021-08-03 DIAGNOSIS — F4323 Adjustment disorder with mixed anxiety and depressed mood: Secondary | ICD-10-CM | POA: Diagnosis not present

## 2021-09-07 DIAGNOSIS — F4323 Adjustment disorder with mixed anxiety and depressed mood: Secondary | ICD-10-CM | POA: Diagnosis not present

## 2021-10-13 DIAGNOSIS — F439 Reaction to severe stress, unspecified: Secondary | ICD-10-CM | POA: Diagnosis not present

## 2021-10-13 DIAGNOSIS — B001 Herpesviral vesicular dermatitis: Secondary | ICD-10-CM | POA: Diagnosis not present

## 2021-10-13 DIAGNOSIS — K219 Gastro-esophageal reflux disease without esophagitis: Secondary | ICD-10-CM | POA: Diagnosis not present

## 2021-10-13 DIAGNOSIS — F900 Attention-deficit hyperactivity disorder, predominantly inattentive type: Secondary | ICD-10-CM | POA: Diagnosis not present

## 2021-10-30 DIAGNOSIS — F4323 Adjustment disorder with mixed anxiety and depressed mood: Secondary | ICD-10-CM | POA: Diagnosis not present

## 2021-12-04 DIAGNOSIS — Z8349 Family history of other endocrine, nutritional and metabolic diseases: Secondary | ICD-10-CM | POA: Diagnosis not present

## 2021-12-04 DIAGNOSIS — G5603 Carpal tunnel syndrome, bilateral upper limbs: Secondary | ICD-10-CM | POA: Diagnosis not present

## 2021-12-04 DIAGNOSIS — Z833 Family history of diabetes mellitus: Secondary | ICD-10-CM | POA: Diagnosis not present

## 2021-12-06 DIAGNOSIS — F4323 Adjustment disorder with mixed anxiety and depressed mood: Secondary | ICD-10-CM | POA: Diagnosis not present

## 2021-12-07 DIAGNOSIS — Z30433 Encounter for removal and reinsertion of intrauterine contraceptive device: Secondary | ICD-10-CM | POA: Diagnosis not present

## 2022-01-09 DIAGNOSIS — F4323 Adjustment disorder with mixed anxiety and depressed mood: Secondary | ICD-10-CM | POA: Diagnosis not present

## 2022-01-16 DIAGNOSIS — Z30431 Encounter for routine checking of intrauterine contraceptive device: Secondary | ICD-10-CM | POA: Diagnosis not present

## 2022-02-07 DIAGNOSIS — L814 Other melanin hyperpigmentation: Secondary | ICD-10-CM | POA: Diagnosis not present

## 2022-02-07 DIAGNOSIS — Z79899 Other long term (current) drug therapy: Secondary | ICD-10-CM | POA: Diagnosis not present

## 2022-02-07 DIAGNOSIS — L700 Acne vulgaris: Secondary | ICD-10-CM | POA: Diagnosis not present

## 2022-03-16 ENCOUNTER — Other Ambulatory Visit (HOSPITAL_COMMUNITY): Payer: Self-pay

## 2022-03-16 MED ORDER — DEXMETHYLPHENIDATE HCL 10 MG PO TABS
10.0000 mg | ORAL_TABLET | Freq: Two times a day (BID) | ORAL | 0 refills | Status: DC
  Filled 2022-03-16: qty 60, 30d supply, fill #0

## 2022-03-16 MED ORDER — INFLUENZA VAC SPLIT QUAD 0.5 ML IM SUSY
0.5000 mL | PREFILLED_SYRINGE | INTRAMUSCULAR | 0 refills | Status: DC
  Filled 2022-03-16: qty 0.5, 1d supply, fill #0

## 2022-04-16 DIAGNOSIS — B001 Herpesviral vesicular dermatitis: Secondary | ICD-10-CM | POA: Diagnosis not present

## 2022-04-16 DIAGNOSIS — F439 Reaction to severe stress, unspecified: Secondary | ICD-10-CM | POA: Diagnosis not present

## 2022-04-16 DIAGNOSIS — F900 Attention-deficit hyperactivity disorder, predominantly inattentive type: Secondary | ICD-10-CM | POA: Diagnosis not present

## 2022-04-16 DIAGNOSIS — K219 Gastro-esophageal reflux disease without esophagitis: Secondary | ICD-10-CM | POA: Diagnosis not present

## 2022-05-02 ENCOUNTER — Other Ambulatory Visit (HOSPITAL_COMMUNITY): Payer: Self-pay

## 2022-05-02 MED ORDER — DEXMETHYLPHENIDATE HCL 5 MG PO TABS
10.0000 mg | ORAL_TABLET | Freq: Two times a day (BID) | ORAL | 0 refills | Status: DC
  Filled 2022-05-02: qty 60, 15d supply, fill #0

## 2022-05-17 ENCOUNTER — Other Ambulatory Visit (HOSPITAL_COMMUNITY): Payer: Self-pay

## 2022-05-17 MED ORDER — DEXMETHYLPHENIDATE HCL 5 MG PO TABS
10.0000 mg | ORAL_TABLET | Freq: Two times a day (BID) | ORAL | 0 refills | Status: DC
  Filled 2022-05-17: qty 120, 30d supply, fill #0

## 2022-05-23 ENCOUNTER — Other Ambulatory Visit (HOSPITAL_BASED_OUTPATIENT_CLINIC_OR_DEPARTMENT_OTHER): Payer: Self-pay

## 2022-05-23 ENCOUNTER — Other Ambulatory Visit (HOSPITAL_COMMUNITY): Payer: Self-pay

## 2022-05-23 MED ORDER — DEXMETHYLPHENIDATE HCL 5 MG PO TABS
10.0000 mg | ORAL_TABLET | Freq: Two times a day (BID) | ORAL | 0 refills | Status: DC
  Filled 2022-05-23: qty 90, 23d supply, fill #0

## 2022-07-04 ENCOUNTER — Other Ambulatory Visit (HOSPITAL_BASED_OUTPATIENT_CLINIC_OR_DEPARTMENT_OTHER): Payer: Self-pay

## 2022-07-04 MED ORDER — DEXMETHYLPHENIDATE HCL 10 MG PO TABS
10.0000 mg | ORAL_TABLET | Freq: Two times a day (BID) | ORAL | 0 refills | Status: DC
  Filled 2022-07-04: qty 60, 30d supply, fill #0

## 2022-08-01 ENCOUNTER — Other Ambulatory Visit (HOSPITAL_BASED_OUTPATIENT_CLINIC_OR_DEPARTMENT_OTHER): Payer: Self-pay

## 2022-08-01 MED ORDER — DEXMETHYLPHENIDATE HCL 10 MG PO TABS
10.0000 mg | ORAL_TABLET | Freq: Two times a day (BID) | ORAL | 0 refills | Status: DC
  Filled 2022-08-01: qty 60, 30d supply, fill #0

## 2022-08-03 DIAGNOSIS — H5213 Myopia, bilateral: Secondary | ICD-10-CM | POA: Diagnosis not present

## 2022-08-03 DIAGNOSIS — D23111 Other benign neoplasm of skin of right upper eyelid, including canthus: Secondary | ICD-10-CM | POA: Diagnosis not present

## 2022-08-03 DIAGNOSIS — H524 Presbyopia: Secondary | ICD-10-CM | POA: Diagnosis not present

## 2022-08-03 DIAGNOSIS — D23112 Other benign neoplasm of skin of right lower eyelid, including canthus: Secondary | ICD-10-CM | POA: Diagnosis not present

## 2022-08-07 DIAGNOSIS — Z1231 Encounter for screening mammogram for malignant neoplasm of breast: Secondary | ICD-10-CM | POA: Diagnosis not present

## 2022-08-07 DIAGNOSIS — Z01419 Encounter for gynecological examination (general) (routine) without abnormal findings: Secondary | ICD-10-CM | POA: Diagnosis not present

## 2022-08-07 DIAGNOSIS — Z6831 Body mass index (BMI) 31.0-31.9, adult: Secondary | ICD-10-CM | POA: Diagnosis not present

## 2022-08-16 DIAGNOSIS — M79642 Pain in left hand: Secondary | ICD-10-CM | POA: Diagnosis not present

## 2022-08-16 DIAGNOSIS — F900 Attention-deficit hyperactivity disorder, predominantly inattentive type: Secondary | ICD-10-CM | POA: Diagnosis not present

## 2022-08-16 DIAGNOSIS — K219 Gastro-esophageal reflux disease without esophagitis: Secondary | ICD-10-CM | POA: Diagnosis not present

## 2022-08-16 DIAGNOSIS — B001 Herpesviral vesicular dermatitis: Secondary | ICD-10-CM | POA: Diagnosis not present

## 2022-08-16 DIAGNOSIS — M79641 Pain in right hand: Secondary | ICD-10-CM | POA: Diagnosis not present

## 2022-08-16 DIAGNOSIS — M25522 Pain in left elbow: Secondary | ICD-10-CM | POA: Diagnosis not present

## 2022-08-16 DIAGNOSIS — G8929 Other chronic pain: Secondary | ICD-10-CM | POA: Diagnosis not present

## 2022-08-16 DIAGNOSIS — F439 Reaction to severe stress, unspecified: Secondary | ICD-10-CM | POA: Diagnosis not present

## 2022-08-17 DIAGNOSIS — G8929 Other chronic pain: Secondary | ICD-10-CM | POA: Diagnosis not present

## 2022-08-17 DIAGNOSIS — M79641 Pain in right hand: Secondary | ICD-10-CM | POA: Diagnosis not present

## 2022-08-17 DIAGNOSIS — M79642 Pain in left hand: Secondary | ICD-10-CM | POA: Diagnosis not present

## 2022-08-17 DIAGNOSIS — M25522 Pain in left elbow: Secondary | ICD-10-CM | POA: Diagnosis not present

## 2022-09-13 ENCOUNTER — Other Ambulatory Visit (HOSPITAL_BASED_OUTPATIENT_CLINIC_OR_DEPARTMENT_OTHER): Payer: Self-pay

## 2022-09-13 MED ORDER — DEXMETHYLPHENIDATE HCL 10 MG PO TABS
10.0000 mg | ORAL_TABLET | Freq: Two times a day (BID) | ORAL | 0 refills | Status: DC
  Filled 2022-09-13: qty 60, 30d supply, fill #0

## 2022-09-17 DIAGNOSIS — R29898 Other symptoms and signs involving the musculoskeletal system: Secondary | ICD-10-CM | POA: Diagnosis not present

## 2022-09-17 DIAGNOSIS — M79641 Pain in right hand: Secondary | ICD-10-CM | POA: Diagnosis not present

## 2022-09-17 DIAGNOSIS — G8929 Other chronic pain: Secondary | ICD-10-CM | POA: Diagnosis not present

## 2022-09-17 DIAGNOSIS — M25522 Pain in left elbow: Secondary | ICD-10-CM | POA: Diagnosis not present

## 2022-09-24 DIAGNOSIS — Z30431 Encounter for routine checking of intrauterine contraceptive device: Secondary | ICD-10-CM | POA: Diagnosis not present

## 2022-09-25 DIAGNOSIS — R29898 Other symptoms and signs involving the musculoskeletal system: Secondary | ICD-10-CM | POA: Diagnosis not present

## 2022-09-25 DIAGNOSIS — G8929 Other chronic pain: Secondary | ICD-10-CM | POA: Diagnosis not present

## 2022-09-25 DIAGNOSIS — M79641 Pain in right hand: Secondary | ICD-10-CM | POA: Diagnosis not present

## 2022-09-25 DIAGNOSIS — M25522 Pain in left elbow: Secondary | ICD-10-CM | POA: Diagnosis not present

## 2022-10-02 DIAGNOSIS — G8929 Other chronic pain: Secondary | ICD-10-CM | POA: Diagnosis not present

## 2022-10-02 DIAGNOSIS — M25522 Pain in left elbow: Secondary | ICD-10-CM | POA: Diagnosis not present

## 2022-10-02 DIAGNOSIS — M79641 Pain in right hand: Secondary | ICD-10-CM | POA: Diagnosis not present

## 2022-10-02 DIAGNOSIS — R29898 Other symptoms and signs involving the musculoskeletal system: Secondary | ICD-10-CM | POA: Diagnosis not present

## 2022-10-09 DIAGNOSIS — G8929 Other chronic pain: Secondary | ICD-10-CM | POA: Diagnosis not present

## 2022-10-09 DIAGNOSIS — R29898 Other symptoms and signs involving the musculoskeletal system: Secondary | ICD-10-CM | POA: Diagnosis not present

## 2022-10-09 DIAGNOSIS — M79641 Pain in right hand: Secondary | ICD-10-CM | POA: Diagnosis not present

## 2022-10-09 DIAGNOSIS — M25522 Pain in left elbow: Secondary | ICD-10-CM | POA: Diagnosis not present

## 2022-10-13 ENCOUNTER — Other Ambulatory Visit (HOSPITAL_BASED_OUTPATIENT_CLINIC_OR_DEPARTMENT_OTHER): Payer: Self-pay

## 2022-10-13 MED ORDER — DEXMETHYLPHENIDATE HCL 10 MG PO TABS
10.0000 mg | ORAL_TABLET | Freq: Two times a day (BID) | ORAL | 0 refills | Status: DC
  Filled 2022-10-13: qty 60, 30d supply, fill #0

## 2022-10-17 DIAGNOSIS — M79641 Pain in right hand: Secondary | ICD-10-CM | POA: Diagnosis not present

## 2022-10-17 DIAGNOSIS — L650 Telogen effluvium: Secondary | ICD-10-CM | POA: Diagnosis not present

## 2022-10-17 DIAGNOSIS — M79642 Pain in left hand: Secondary | ICD-10-CM | POA: Diagnosis not present

## 2022-10-17 DIAGNOSIS — G8929 Other chronic pain: Secondary | ICD-10-CM | POA: Diagnosis not present

## 2022-10-17 DIAGNOSIS — L700 Acne vulgaris: Secondary | ICD-10-CM | POA: Diagnosis not present

## 2022-10-17 DIAGNOSIS — M25522 Pain in left elbow: Secondary | ICD-10-CM | POA: Diagnosis not present

## 2022-10-19 DIAGNOSIS — M25522 Pain in left elbow: Secondary | ICD-10-CM | POA: Diagnosis not present

## 2022-10-19 DIAGNOSIS — M79641 Pain in right hand: Secondary | ICD-10-CM | POA: Diagnosis not present

## 2022-10-19 DIAGNOSIS — R29898 Other symptoms and signs involving the musculoskeletal system: Secondary | ICD-10-CM | POA: Diagnosis not present

## 2022-10-19 DIAGNOSIS — G8929 Other chronic pain: Secondary | ICD-10-CM | POA: Diagnosis not present

## 2022-10-26 DIAGNOSIS — R29898 Other symptoms and signs involving the musculoskeletal system: Secondary | ICD-10-CM | POA: Diagnosis not present

## 2022-10-26 DIAGNOSIS — G8929 Other chronic pain: Secondary | ICD-10-CM | POA: Diagnosis not present

## 2022-10-26 DIAGNOSIS — M25522 Pain in left elbow: Secondary | ICD-10-CM | POA: Diagnosis not present

## 2022-10-26 DIAGNOSIS — M79641 Pain in right hand: Secondary | ICD-10-CM | POA: Diagnosis not present

## 2022-11-01 DIAGNOSIS — M25522 Pain in left elbow: Secondary | ICD-10-CM | POA: Diagnosis not present

## 2022-11-01 DIAGNOSIS — M79641 Pain in right hand: Secondary | ICD-10-CM | POA: Diagnosis not present

## 2022-11-01 DIAGNOSIS — R29898 Other symptoms and signs involving the musculoskeletal system: Secondary | ICD-10-CM | POA: Diagnosis not present

## 2022-11-01 DIAGNOSIS — G8929 Other chronic pain: Secondary | ICD-10-CM | POA: Diagnosis not present

## 2022-11-12 DIAGNOSIS — M25522 Pain in left elbow: Secondary | ICD-10-CM | POA: Diagnosis not present

## 2022-11-12 DIAGNOSIS — M7712 Lateral epicondylitis, left elbow: Secondary | ICD-10-CM | POA: Diagnosis not present

## 2022-11-12 DIAGNOSIS — G8929 Other chronic pain: Secondary | ICD-10-CM | POA: Diagnosis not present

## 2022-11-13 ENCOUNTER — Other Ambulatory Visit (HOSPITAL_BASED_OUTPATIENT_CLINIC_OR_DEPARTMENT_OTHER): Payer: Self-pay

## 2022-11-13 MED ORDER — DEXMETHYLPHENIDATE HCL 10 MG PO TABS
10.0000 mg | ORAL_TABLET | Freq: Two times a day (BID) | ORAL | 0 refills | Status: DC
  Filled 2022-11-13: qty 60, 30d supply, fill #0

## 2022-12-03 DIAGNOSIS — D2262 Melanocytic nevi of left upper limb, including shoulder: Secondary | ICD-10-CM | POA: Diagnosis not present

## 2022-12-03 DIAGNOSIS — D225 Melanocytic nevi of trunk: Secondary | ICD-10-CM | POA: Diagnosis not present

## 2022-12-03 DIAGNOSIS — D2271 Melanocytic nevi of right lower limb, including hip: Secondary | ICD-10-CM | POA: Diagnosis not present

## 2022-12-03 DIAGNOSIS — D2261 Melanocytic nevi of right upper limb, including shoulder: Secondary | ICD-10-CM | POA: Diagnosis not present

## 2022-12-13 ENCOUNTER — Other Ambulatory Visit (HOSPITAL_BASED_OUTPATIENT_CLINIC_OR_DEPARTMENT_OTHER): Payer: Self-pay

## 2022-12-13 MED ORDER — DEXMETHYLPHENIDATE HCL 10 MG PO TABS
10.0000 mg | ORAL_TABLET | Freq: Two times a day (BID) | ORAL | 0 refills | Status: DC
  Filled 2022-12-13: qty 60, 30d supply, fill #0

## 2022-12-28 DIAGNOSIS — M25522 Pain in left elbow: Secondary | ICD-10-CM | POA: Diagnosis not present

## 2022-12-28 DIAGNOSIS — M7712 Lateral epicondylitis, left elbow: Secondary | ICD-10-CM | POA: Diagnosis not present

## 2022-12-28 DIAGNOSIS — G8929 Other chronic pain: Secondary | ICD-10-CM | POA: Diagnosis not present

## 2023-01-07 DIAGNOSIS — R519 Headache, unspecified: Secondary | ICD-10-CM | POA: Diagnosis not present

## 2023-01-07 DIAGNOSIS — F419 Anxiety disorder, unspecified: Secondary | ICD-10-CM | POA: Diagnosis not present

## 2023-01-14 ENCOUNTER — Other Ambulatory Visit (HOSPITAL_BASED_OUTPATIENT_CLINIC_OR_DEPARTMENT_OTHER): Payer: Self-pay

## 2023-01-14 MED ORDER — CITALOPRAM HYDROBROMIDE 20 MG PO TABS
20.0000 mg | ORAL_TABLET | Freq: Every day | ORAL | 1 refills | Status: DC
  Filled 2023-01-14: qty 30, 30d supply, fill #0

## 2023-01-14 MED ORDER — DEXMETHYLPHENIDATE HCL 10 MG PO TABS
10.0000 mg | ORAL_TABLET | Freq: Two times a day (BID) | ORAL | 0 refills | Status: DC
  Filled 2023-01-14: qty 60, 30d supply, fill #0

## 2023-01-23 DIAGNOSIS — M7712 Lateral epicondylitis, left elbow: Secondary | ICD-10-CM | POA: Diagnosis not present

## 2023-01-30 DIAGNOSIS — J069 Acute upper respiratory infection, unspecified: Secondary | ICD-10-CM | POA: Diagnosis not present

## 2023-01-30 DIAGNOSIS — R509 Fever, unspecified: Secondary | ICD-10-CM | POA: Diagnosis not present

## 2023-01-30 DIAGNOSIS — J029 Acute pharyngitis, unspecified: Secondary | ICD-10-CM | POA: Diagnosis not present

## 2023-01-31 DIAGNOSIS — J069 Acute upper respiratory infection, unspecified: Secondary | ICD-10-CM | POA: Diagnosis not present

## 2023-02-07 DIAGNOSIS — J988 Other specified respiratory disorders: Secondary | ICD-10-CM | POA: Diagnosis not present

## 2023-02-13 ENCOUNTER — Other Ambulatory Visit (HOSPITAL_BASED_OUTPATIENT_CLINIC_OR_DEPARTMENT_OTHER): Payer: Self-pay

## 2023-02-13 MED ORDER — DEXMETHYLPHENIDATE HCL 10 MG PO TABS
10.0000 mg | ORAL_TABLET | Freq: Two times a day (BID) | ORAL | 0 refills | Status: DC
  Filled 2023-02-13: qty 60, 30d supply, fill #0

## 2023-02-14 DIAGNOSIS — L659 Nonscarring hair loss, unspecified: Secondary | ICD-10-CM | POA: Diagnosis not present

## 2023-02-14 DIAGNOSIS — F439 Reaction to severe stress, unspecified: Secondary | ICD-10-CM | POA: Diagnosis not present

## 2023-02-14 DIAGNOSIS — F900 Attention-deficit hyperactivity disorder, predominantly inattentive type: Secondary | ICD-10-CM | POA: Diagnosis not present

## 2023-02-14 DIAGNOSIS — B001 Herpesviral vesicular dermatitis: Secondary | ICD-10-CM | POA: Diagnosis not present

## 2023-02-14 DIAGNOSIS — K219 Gastro-esophageal reflux disease without esophagitis: Secondary | ICD-10-CM | POA: Diagnosis not present

## 2023-02-15 DIAGNOSIS — L650 Telogen effluvium: Secondary | ICD-10-CM | POA: Diagnosis not present

## 2023-02-15 DIAGNOSIS — Z79899 Other long term (current) drug therapy: Secondary | ICD-10-CM | POA: Diagnosis not present

## 2023-02-15 DIAGNOSIS — L700 Acne vulgaris: Secondary | ICD-10-CM | POA: Diagnosis not present

## 2023-02-26 ENCOUNTER — Other Ambulatory Visit (HOSPITAL_COMMUNITY): Payer: Self-pay

## 2023-03-11 ENCOUNTER — Ambulatory Visit: Payer: BC Managed Care – PPO | Admitting: Psychiatry

## 2023-03-18 ENCOUNTER — Ambulatory Visit: Payer: BC Managed Care – PPO | Admitting: Adult Health

## 2023-03-18 ENCOUNTER — Other Ambulatory Visit (HOSPITAL_BASED_OUTPATIENT_CLINIC_OR_DEPARTMENT_OTHER): Payer: Self-pay

## 2023-03-18 ENCOUNTER — Encounter: Payer: Self-pay | Admitting: Adult Health

## 2023-03-18 DIAGNOSIS — F900 Attention-deficit hyperactivity disorder, predominantly inattentive type: Secondary | ICD-10-CM | POA: Diagnosis not present

## 2023-03-18 MED ORDER — METHYLPHENIDATE HCL ER (OSM) 27 MG PO TBCR
27.0000 mg | EXTENDED_RELEASE_TABLET | ORAL | 0 refills | Status: DC
Start: 2023-03-18 — End: 2023-04-15
  Filled 2023-03-18: qty 30, 30d supply, fill #0

## 2023-03-18 MED ORDER — DEXMETHYLPHENIDATE HCL 10 MG PO TABS
10.0000 mg | ORAL_TABLET | Freq: Two times a day (BID) | ORAL | 0 refills | Status: DC
  Filled 2023-03-18: qty 60, 30d supply, fill #0

## 2023-03-18 NOTE — Progress Notes (Signed)
Secret Cradle [id number] [birth year] 42 y.o.  Subjective:   Patient ID:  Nicole Todd is a 42 y.o. (DOB 02-16-1981) female.  Chief Complaint: No chief complaint on file.   HPI Kimberlyanne Choudhury presents to the office today for follow-up of ADHD.  Patient seen today for initial psychiatric evaluation.   Referred by PCP.  Describes mood today as "lower". Denies tearfulness. Pleasant. Mood symptoms - reports depression - feels lonely - no local family or network. Reports some situational anxiety and irritability. Denies panic attacks. Denies worry, rumination and over thinking. Mood is stable - feels blah - flat lined. Reports taking current medications Focalin 10mg  BID and Celexa 20mg  daily, but not sure if they are helpful. 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 Her father and other family members also diagnosed with ADD. Her PCP is prescribing ADD medications currently, but she would like to explore other options. Varying interest and motivation. Taking medications as prescribed.  Energy levels lower. Active, does not have a regular exercise routine.  Enjoys some usual interests and activities. Divorced. Lives alone with 2 sons. Spending time with family. Appetite adequate. Weight gain - 194 pounds.  Sleeps well most nights. Averages 4 to 8 hours. Focus and concentration difficulties - "terrible" - medications helping somewhat. Completing tasks. Managing aspects of household. 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 Denies SI or HI.  Denies AH or VH. Denies SI or HI. Denies AH and VH.   Has not seen a therapist recently.  Previous medication trials: Celexa 20mg  daily  Review of Systems:  Review of Systems  Musculoskeletal:  Negative for gait problem.  Neurological:  Negative for tremors.  Psychiatric/Behavioral:         Please refer to HPI    Medications: I have reviewed the patient's current medications.  Current  Outpatient Medications  Medication Sig Dispense Refill   methylphenidate (CONCERTA) 27 MG PO CR tablet Take 1 tablet (27 mg total) by mouth every morning. 30 tablet 0   spironolactone (ALDACTONE) 100 MG tablet TAKE 1 TABLET BY MOUTH EVERY DAY WITH FOOD. DO NOT USE IF PREGNANT     Calcium Aspartate 75 MG TABS Take by mouth.     calcium carbonate (TUMS - DOSED IN MG ELEMENTAL CALCIUM) 500 MG chewable tablet Chew 1 tablet by mouth.     Cholecalciferol (VITAMIN D3) 2000 UNITS capsule Take by mouth.     citalopram (CELEXA) 20 MG tablet Take 1 tablet (20 mg total) by mouth daily. 90 tablet 1   famotidine (PEPCID) 10 MG tablet Take by mouth.     influenza vac split quadrivalent PF (FLUARIX) 0.5 ML injection Inject 0.5 mLs into the muscle. 0.5 mL 0   omeprazole (PRILOSEC) 20 MG capsule Take 20 mg by mouth.     polyethylene glycol-electrolytes (NULYTELY/GOLYTELY) 420 G solution MIX AND DRINK UTD  0   valACYclovir (VALTREX) 500 MG tablet Take by mouth.     No current facility-administered medications for this visit.    Medication Side Effects: None  Allergies:  Allergies  Allergen Reactions   Meperidine     Past Medical History:  Diagnosis Date   GERD (gastroesophageal reflux disease)     Past Medical History, Surgical history, Social history, and Family history were reviewed and updated as appropriate.   Please see review of systems for further details on the patient's review from today.   Objective:  Physical Exam:  There were no vitals taken for this visit.  Physical Exam Constitutional:      General: She is not in acute distress. Musculoskeletal:        General: No deformity.  Neurological:     Mental Status: She is alert and oriented to person, place, and time.     Coordination: Coordination normal.  Psychiatric:        Attention and Perception: Attention and perception normal. She does not perceive auditory or visual hallucinations.        Mood and Affect: Affect is not  labile, blunt, angry or inappropriate.        Speech: Speech normal.        Behavior: Behavior normal.        Thought Content: Thought content normal. Thought content is not paranoid or delusional. Thought content does not include homicidal or suicidal ideation. Thought content does not include homicidal or suicidal plan.        Cognition and Memory: Cognition and memory normal.        Judgment: Judgment normal.     Comments: Insight intact     Lab Review:  No results found for: "NA", "K", "CL", "CO2", "GLUCOSE", "BUN", "CREATININE", "CALCIUM", "PROT", "ALBUMIN", "AST", "ALT", "ALKPHOS", "BILITOT", "GFRNONAA", "GFRAA"  No results found for: "WBC", "RBC", "HGB", "HCT", "PLT", "MCV", "MCH", "MCHC", "RDW", "LYMPHSABS", "MONOABS", "EOSABS", "BASOSABS"  No results found for: "POCLITH", "LITHIUM"   No results found for: "PHENYTOIN", "PHENOBARB", "VALPROATE", "CBMZ"   .res Assessment: Plan:    Plan:  PDMP reviewed  Celexa 20mg  daily  D/C Focalin 10mg  BID Add Concerta 27mg  daily  Consider adding Wellbutrin XL 150mg  - denies seizure history  RTC 4 weeks  Patient advised to contact office with any questions, adverse effects, or acute worsening in signs and symptoms.  Discussed potential benefits, risks, and side effects of stimulants with patient to include increased heart rate, palpitations, insomnia, increased anxiety, increased irritability, or decreased appetite.  Instructed patient to contact office if experiencing any significant tolerability issues.     Diagnoses and all orders for this visit:  Attention deficit hyperactivity disorder (ADHD), predominantly inattentive type -     methylphenidate (CONCERTA) 27 MG PO CR tablet; Take 1 tablet (27 mg total) by mouth every morning.     Please see After Visit Summary for patient specific instructions.  No future appointments.  No orders of the defined types were placed in this encounter.   -------------------------------

## 2023-03-19 ENCOUNTER — Other Ambulatory Visit (HOSPITAL_BASED_OUTPATIENT_CLINIC_OR_DEPARTMENT_OTHER): Payer: Self-pay

## 2023-03-19 MED ORDER — INFLUENZA VIRUS VACC SPLIT PF (FLUZONE) 0.5 ML IM SUSY
0.5000 mL | PREFILLED_SYRINGE | Freq: Once | INTRAMUSCULAR | 0 refills | Status: AC
  Filled 2023-03-19: qty 0.5, 1d supply, fill #0

## 2023-04-03 ENCOUNTER — Encounter: Payer: Self-pay | Admitting: Psychiatry

## 2023-04-15 ENCOUNTER — Ambulatory Visit: Payer: BC Managed Care – PPO | Admitting: Adult Health

## 2023-04-15 ENCOUNTER — Encounter: Payer: Self-pay | Admitting: Adult Health

## 2023-04-15 DIAGNOSIS — F900 Attention-deficit hyperactivity disorder, predominantly inattentive type: Secondary | ICD-10-CM | POA: Diagnosis not present

## 2023-04-15 MED ORDER — BUPROPION HCL ER (XL) 150 MG PO TB24: 150.0000 mg | ORAL_TABLET | Freq: Every day | ORAL | 2 refills | Status: DC

## 2023-04-15 MED ORDER — METHYLPHENIDATE HCL ER (OSM) 27 MG PO TBCR
27.0000 mg | EXTENDED_RELEASE_TABLET | ORAL | 0 refills | Status: DC
Start: 2023-04-15 — End: 2023-05-13

## 2023-04-15 NOTE — Progress Notes (Signed)
Nicole Todd [id number] Jul 30, 1980 42 y.o.  Subjective:   Patient ID:  Nicole Todd is a 42 y.o. (DOB May 03, 1981) female.  Chief Complaint: No chief complaint on file.   HPI Nicole Todd presents to the office today for follow-up of ADHD.  Patient seen today for initial psychiatric evaluation.   Referred by PCP.  Describes mood today as "lower". Pleasant. Denies tearfulness. Pleasant. Mood symptoms - denies depression, anxiety and irritability. Denies panic attacks. Denies worry, rumination and over thinking. Reports lacking interest and motivation. Mood is stable. Feels like the addition of Concerta has been helpful. Taking medications as prescribed.  Energy levels lower. Active, does not have a regular exercise routine.  Enjoys some usual interests and activities. Divorced. Lives alone with 2 sons. Spending time with family. Appetite adequate. Weight gain - 194 pounds.  Sleeps well most nights. Averages 6 to 8 hours. Focus and concentration improved. Completing tasks. Managing aspects of household. She is currently unemployed, but plans to start looking - attorney. Denies SI or HI.  Denies AH or VH. Denies SI or HI. Denies AH and VH.   Has not seen a therapist recently.  Previous medication trials: Celexa 20mg  daily   Review of Systems:  Review of Systems  Musculoskeletal:  Negative for gait problem.  Neurological:  Negative for tremors.  Psychiatric/Behavioral:         Please refer to HPI    Medications: I have reviewed the patient's current medications.  Current Outpatient Medications  Medication Sig Dispense Refill   Calcium Aspartate 75 MG TABS Take by mouth.     calcium carbonate (TUMS - DOSED IN MG ELEMENTAL CALCIUM) 500 MG chewable tablet Chew 1 tablet by mouth.     Cholecalciferol (VITAMIN D3) 2000 UNITS capsule Take by mouth.     citalopram (CELEXA) 20 MG tablet Take 1 tablet (20 mg total) by mouth daily. 90 tablet 1   famotidine (PEPCID) 10 MG tablet Take by  mouth.     influenza vac split quadrivalent PF (FLUARIX) 0.5 ML injection Inject 0.5 mLs into the muscle. 0.5 mL 0   methylphenidate (CONCERTA) 27 MG PO CR tablet Take 1 tablet (27 mg total) by mouth every morning. 30 tablet 0   omeprazole (PRILOSEC) 20 MG capsule Take 20 mg by mouth.     polyethylene glycol-electrolytes (NULYTELY/GOLYTELY) 420 G solution MIX AND DRINK UTD  0   spironolactone (ALDACTONE) 100 MG tablet TAKE 1 TABLET BY MOUTH EVERY DAY WITH FOOD. DO NOT USE IF PREGNANT     valACYclovir (VALTREX) 500 MG tablet Take by mouth.     No current facility-administered medications for this visit.    Medication Side Effects: None  Allergies:  Allergies  Allergen Reactions   Meperidine     Past Medical History:  Diagnosis Date   GERD (gastroesophageal reflux disease)     Past Medical History, Surgical history, Social history, and Family history were reviewed and updated as appropriate.   Please see review of systems for further details on the patient's review from today.   Objective:   Physical Exam:  There were no vitals taken for this visit.  Physical Exam Constitutional:      General: She is not in acute distress. Musculoskeletal:        General: No deformity.  Neurological:     Mental Status: She is alert and oriented to person, place, and time.     Coordination: Coordination normal.  Psychiatric:        Attention  and Perception: Attention and perception normal. She does not perceive auditory or visual hallucinations.        Mood and Affect: Mood normal. Mood is not anxious or depressed. Affect is not labile, blunt, angry or inappropriate.        Speech: Speech normal.        Behavior: Behavior normal.        Thought Content: Thought content normal. Thought content is not paranoid or delusional. Thought content does not include homicidal or suicidal ideation. Thought content does not include homicidal or suicidal plan.        Cognition and Memory: Cognition and  memory normal.        Judgment: Judgment normal.     Comments: Insight intact     Lab Review:  No results found for: "NA", "K", "CL", "CO2", "GLUCOSE", "BUN", "CREATININE", "CALCIUM", "PROT", "ALBUMIN", "AST", "ALT", "ALKPHOS", "BILITOT", "GFRNONAA", "GFRAA"  No results found for: "WBC", "RBC", "HGB", "HCT", "PLT", "MCV", "MCH", "MCHC", "RDW", "LYMPHSABS", "MONOABS", "EOSABS", "BASOSABS"  No results found for: "POCLITH", "LITHIUM"   No results found for: "PHENYTOIN", "PHENOBARB", "VALPROATE", "CBMZ"   .res Assessment: Plan:    Plan:  PDMP reviewed  Celexa 20mg  daily Concerta 27mg  daily  Add Wellbutrin XL 150mg  - denies seizure history  109/77/88  RTC 4 weeks  Patient advised to contact office with any questions, adverse effects, or acute worsening in signs and symptoms.  Discussed potential benefits, risks, and side effects of stimulants with patient to include increased heart rate, palpitations, insomnia, increased anxiety, increased irritability, or decreased appetite.  Instructed patient to contact office if experiencing any significant tolerability issues.    There are no diagnoses linked to this encounter.   Please see After Visit Summary for patient specific instructions.  Future Appointments  Date Time Provider Department Center  04/15/2023  2:40 PM Deland Slocumb, Thereasa Solo, NP CP-CP None    No orders of the defined types were placed in this encounter.   -------------------------------

## 2023-05-13 ENCOUNTER — Telehealth: Payer: BC Managed Care – PPO | Admitting: Adult Health

## 2023-05-13 ENCOUNTER — Encounter: Payer: Self-pay | Admitting: Adult Health

## 2023-05-13 DIAGNOSIS — F909 Attention-deficit hyperactivity disorder, unspecified type: Secondary | ICD-10-CM | POA: Diagnosis not present

## 2023-05-13 DIAGNOSIS — F900 Attention-deficit hyperactivity disorder, predominantly inattentive type: Secondary | ICD-10-CM

## 2023-05-13 MED ORDER — METHYLPHENIDATE HCL ER (OSM) 27 MG PO TBCR: 27.0000 mg | EXTENDED_RELEASE_TABLET | ORAL | 0 refills | Status: DC

## 2023-05-13 MED ORDER — CITALOPRAM HYDROBROMIDE 20 MG PO TABS: 20.0000 mg | ORAL_TABLET | Freq: Every day | ORAL | 1 refills | Status: DC

## 2023-05-13 NOTE — Progress Notes (Signed)
Nicole Todd [id number] 08-Feb-1981 42 y.o.  Virtual Visit via Video Note  I connected with pt @ on 05/13/23 at 11:40 AM EST by a video enabled telemedicine application and verified that I am speaking with the correct person using two identifiers.   I discussed the limitations of evaluation and management by telemedicine and the availability of in person appointments. The patient expressed understanding and agreed to proceed.  I discussed the assessment and treatment plan with the patient. The patient was provided an opportunity to ask questions and all were answered. The patient agreed with the plan and demonstrated an understanding of the instructions.   The patient was advised to call back or seek an in-person evaluation if the symptoms worsen or if the condition fails to improve as anticipated.  I provided 10 minutes of non-face-to-face time during this encounter.  The patient was located at home.  The provider was located at [facility].   Dorothyann Gibbs, NP   Subjective:   Patient ID:  Nicole Todd is a 42 y.o. (DOB 1980/09/25) female.  Chief Complaint: No chief complaint on file.   HPI Nicole Todd for follow-up of ADHD.  Referred by PCP.  Describes mood today as "better". Pleasant. Denies tearfulness. Mood symptoms - denies depression, anxiety and irritability. Denies panic attacks. Denies worry, rumination and over thinking. Reports improved interest and motivation. Mood is stable. Feels like the addition of Wellbutrin has been helpful. Taking medications as prescribed.  Energy levels increased. Active, does not have a regular exercise routine.  Enjoys some usual interests and activities. Divorced. Lives alone with 2 sons. Spending time with family. Appetite adequate. Weight stable. Sleeps well most nights. Averages 6 to 8 hours. Focus and concentration improved. Completing tasks. Managing aspects of household. She is currently unemployed, but plans to  start looking - attorney. Denies SI or HI.  Denies AH or VH. Denies SI or HI. Denies AH and VH.   Has not seen a therapist recently.  Previous medication trials: Celexa 20mg  daily   Review of Systems:  Review of Systems  Musculoskeletal:  Negative for gait problem.  Neurological:  Negative for tremors.  Psychiatric/Behavioral:         Please refer to HPI    Medications: I have reviewed the patient's current medications.  Current Outpatient Medications  Medication Sig Dispense Refill   buPROPion (WELLBUTRIN XL) 150 MG 24 hr tablet Take 1 tablet (150 mg total) by mouth daily. 30 tablet 2   Calcium Aspartate 75 MG TABS Take by mouth.     calcium carbonate (TUMS - DOSED IN MG ELEMENTAL CALCIUM) 500 MG chewable tablet Chew 1 tablet by mouth.     Cholecalciferol (VITAMIN D3) 2000 UNITS capsule Take by mouth.     citalopram (CELEXA) 20 MG tablet Take 1 tablet (20 mg total) by mouth daily. 90 tablet 1   famotidine (PEPCID) 10 MG tablet Take by mouth.     influenza vac split quadrivalent PF (FLUARIX) 0.5 ML injection Inject 0.5 mLs into the muscle. 0.5 mL 0   methylphenidate (CONCERTA) 27 MG PO CR tablet Take 1 tablet (27 mg total) by mouth every morning. 30 tablet 0   omeprazole (PRILOSEC) 20 MG capsule Take 20 mg by mouth.     polyethylene glycol-electrolytes (NULYTELY/GOLYTELY) 420 G solution MIX AND DRINK UTD  0   spironolactone (ALDACTONE) 100 MG tablet TAKE 1 TABLET BY MOUTH EVERY DAY WITH FOOD. DO NOT USE IF PREGNANT     valACYclovir (  VALTREX) 500 MG tablet Take by mouth.     No current facility-administered medications for this visit.    Medication Side Effects: Headache  Allergies:  Allergies  Allergen Reactions   Meperidine    Meperidine Hcl Other (See Comments)    Past Medical History:  Diagnosis Date   GERD (gastroesophageal reflux disease)     No family history on file.  Social History   Socioeconomic History   Marital status: Unknown    Spouse name: Not  on file   Number of children: Not on file   Years of education: Not on file   Highest education level: Not on file  Occupational History   Not on file  Tobacco Use   Smoking status: Never   Smokeless tobacco: Never  Substance and Sexual Activity   Alcohol use: No    Alcohol/week: 0.0 standard drinks of alcohol   Drug use: No   Sexual activity: Not on file  Other Topics Concern   Not on file  Social History Narrative   Not on file   Social Drivers of Health   Financial Resource Strain: Not on file  Food Insecurity: Not on file  Transportation Needs: Not on file  Physical Activity: Not on file  Stress: Not on file  Social Connections: Unknown (09/28/2021)   Received from [facility], [facility]   Social Network    Social Network: Not on file  Intimate Partner Violence: Unknown (08/23/2021)   Received from [facility], [facility]   HITS    Physically Hurt: Not on file    Insult or Talk Down To: Not on file    Threaten Physical Harm: Not on file    Scream or Curse: Not on file    Past Medical History, Surgical history, Social history, and Family history were reviewed and updated as appropriate.   Please see review of systems for further details on the patient's review from today.   Objective:   Physical Exam:  There were no vitals taken for this visit.  Physical Exam Constitutional:      General: She is not in acute distress. Musculoskeletal:        General: No deformity.  Neurological:     Mental Status: She is alert and oriented to person, place, and time.     Coordination: Coordination normal.  Psychiatric:        Attention and Perception: Attention and perception normal. She does not perceive auditory or visual hallucinations.        Mood and Affect: Affect is not labile, blunt, angry or inappropriate.        Speech: Speech normal.        Behavior: Behavior normal.        Thought Content: Thought content normal. Thought content is not paranoid or  delusional. Thought content does not include homicidal or suicidal ideation. Thought content does not include homicidal or suicidal plan.        Cognition and Memory: Cognition and memory normal.        Judgment: Judgment normal.     Comments: Insight intact     Lab Review:  No results found for: "NA", "K", "CL", "CO2", "GLUCOSE", "BUN", "CREATININE", "CALCIUM", "PROT", "ALBUMIN", "AST", "ALT", "ALKPHOS", "BILITOT", "GFRNONAA", "GFRAA"  No results found for: "WBC", "RBC", "HGB", "HCT", "PLT", "MCV", "MCH", "MCHC", "RDW", "LYMPHSABS", "MONOABS", "EOSABS", "BASOSABS"  No results found for: "POCLITH", "LITHIUM"   No results found for: "PHENYTOIN", "PHENOBARB", "VALPROATE", "CBMZ"   .res Assessment: Plan:  Plan:  PDMP reviewed  Celexa 20mg  daily Concerta 27mg  daily Wellbutrin XL 150mg  - denies seizure history  Monitor BP between visits while taking stimulant medication.   RTC 4 weeks  Patient advised to contact office with any questions, adverse effects, or acute worsening in signs and symptoms.  Discussed potential benefits, risks, and side effects of stimulants with patient to include increased heart rate, palpitations, insomnia, increased anxiety, increased irritability, or decreased appetite.  Instructed patient to contact office if experiencing any significant tolerability issues.   There are no diagnoses linked to this encounter.   Please see After Visit Summary for patient specific instructions.  Future Appointments  Date Time Provider Department Center  05/13/2023 11:40 AM Xzavier Swinger, Thereasa Solo, NP CP-CP None    No orders of the defined types were placed in this encounter.     -------------------------------

## 2023-05-20 ENCOUNTER — Other Ambulatory Visit: Payer: Self-pay

## 2023-06-21 DIAGNOSIS — M533 Sacrococcygeal disorders, not elsewhere classified: Secondary | ICD-10-CM | POA: Diagnosis not present

## 2023-06-21 DIAGNOSIS — H9201 Otalgia, right ear: Secondary | ICD-10-CM | POA: Diagnosis not present

## 2023-06-26 ENCOUNTER — Encounter: Payer: Self-pay | Admitting: Adult Health

## 2023-06-26 ENCOUNTER — Telehealth: Payer: BC Managed Care – PPO | Admitting: Adult Health

## 2023-06-26 DIAGNOSIS — F900 Attention-deficit hyperactivity disorder, predominantly inattentive type: Secondary | ICD-10-CM

## 2023-06-26 MED ORDER — METHYLPHENIDATE HCL ER (OSM) 27 MG PO TBCR: 27.0000 mg | EXTENDED_RELEASE_TABLET | ORAL | 0 refills | Status: DC

## 2023-06-26 MED ORDER — BUPROPION HCL ER (XL) 300 MG PO TB24: 300.0000 mg | ORAL_TABLET | Freq: Every day | ORAL | 2 refills | Status: DC

## 2023-06-26 NOTE — Progress Notes (Signed)
 Allante Beane [id number] [birth year] 43 y.o.  Virtual Visit via Video Note  I connected with pt @ on 06/26/23 at 10:00 AM EST by a video enabled telemedicine application and verified that I am speaking with the correct person using two identifiers.   I discussed the limitations of evaluation and management by telemedicine and the availability of in person appointments. The patient expressed understanding and agreed to proceed.  I discussed the assessment and treatment plan with the patient. The patient was provided an opportunity to ask questions and all were answered. The patient agreed with the plan and demonstrated an understanding of the instructions.   The patient was advised to call back or seek an in-person evaluation if the symptoms worsen or if the condition fails to improve as anticipated.  I provided 25 minutes of non-face-to-face time during this encounter.  The patient was located at home.  The provider was located at [facility].   Angeline LOISE Sayers, NP   Subjective:   Patient ID:  Carlia Bomkamp is a 43 y.o. (DOB 1980/06/14) female.  Chief Complaint: No chief complaint on file.   HPI Amaria Mundorf presents for follow-up of ADHD.  Referred by PCP.  Describes mood today as better. Pleasant. Denies tearfulness. Mood symptoms - denies depression, anxiety and irritability. Denies panic attacks. Denies worry, rumination and over thinking. Reports decreased interest and motivation. Mood is flat - I don't really feel anything - not negative. Feels like the addition of Wellbutrin  was helpful initially - willing to consider an increase. Taking medications as prescribed.  Energy levels lower. Active, does not have a regular exercise routine.  Enjoys some usual interests and activities. Divorced. Lives alone with 2 sons. Spending time with family. Appetite adequate. Weight gain over past year and a 1/2. Sleeps well most nights. Averages 6 to 8 hours. Focus and  concentration improved - feels like the Concerta  27mg  daily is helpful. Completing tasks. Managing aspects of household. She is currently unemployed, but plans to start looking - attorney. Denies SI or HI.  Denies AH or VH. Denies self harm. Denies substance use.  Has not seen a therapist recently.  Previous medication trials: Celexa  20mg  daily  Review of Systems:  Review of Systems  Musculoskeletal:  Negative for gait problem.  Neurological:  Negative for tremors.  Psychiatric/Behavioral:         Please refer to HPI    Medications: I have reviewed the patient's current medications.  Current Outpatient Medications  Medication Sig Dispense Refill   buPROPion  (WELLBUTRIN  XL) 150 MG 24 hr tablet Take 1 tablet (150 mg total) by mouth daily. 30 tablet 2   Calcium Aspartate 75 MG TABS Take by mouth.     calcium carbonate (TUMS - DOSED IN MG ELEMENTAL CALCIUM) 500 MG chewable tablet Chew 1 tablet by mouth.     Cholecalciferol (VITAMIN D3) 2000 UNITS capsule Take by mouth.     citalopram  (CELEXA ) 20 MG tablet Take 1 tablet (20 mg total) by mouth daily. 90 tablet 1   famotidine (PEPCID) 10 MG tablet Take by mouth.     influenza vac split quadrivalent PF (FLUARIX) 0.5 ML injection Inject 0.5 mLs into the muscle. 0.5 mL 0   methylphenidate  (CONCERTA ) 27 MG PO CR tablet Take 1 tablet (27 mg total) by mouth every morning. 30 tablet 0   methylphenidate  (CONCERTA ) 27 MG PO CR tablet Take 1 tablet (27 mg total) by mouth every morning. 30 tablet 0   omeprazole (PRILOSEC) 20 MG  capsule Take 20 mg by mouth.     polyethylene glycol-electrolytes (NULYTELY/GOLYTELY) 420 G solution MIX AND DRINK UTD  0   spironolactone (ALDACTONE) 100 MG tablet TAKE 1 TABLET BY MOUTH EVERY DAY WITH FOOD. DO NOT USE IF PREGNANT     valACYclovir (VALTREX) 500 MG tablet Take by mouth.     No current facility-administered medications for this visit.    Medication Side Effects: None  Allergies:  Allergies  Allergen  Reactions   Meperidine    Meperidine Hcl Other (See Comments)    Past Medical History:  Diagnosis Date   GERD (gastroesophageal reflux disease)     No family history on file.  Social History   Socioeconomic History   Marital status: Unknown    Spouse name: Not on file   Number of children: Not on file   Years of education: Not on file   Highest education level: Not on file  Occupational History   Not on file  Tobacco Use   Smoking status: Never   Smokeless tobacco: Never  Substance and Sexual Activity   Alcohol use: No    Alcohol/week: 0.0 standard drinks of alcohol   Drug use: No   Sexual activity: Not on file  Other Topics Concern   Not on file  Social History Narrative   Not on file   Social Drivers of Health   Financial Resource Strain: Not on file  Food Insecurity: Not on file  Transportation Needs: Not on file  Physical Activity: Not on file  Stress: Not on file  Social Connections: Unknown (09/28/2021)   Received from [facility], [facility]   Social Network    Social Network: Not on file  Intimate Partner Violence: Unknown (08/23/2021)   Received from [facility], [facility]   HITS    Physically Hurt: Not on file    Insult or Talk Down To: Not on file    Threaten Physical Harm: Not on file    Scream or Curse: Not on file    Past Medical History, Surgical history, Social history, and Family history were reviewed and updated as appropriate.   Please see review of systems for further details on the patient's review from today.   Objective:   Physical Exam:  There were no vitals taken for this visit.  Physical Exam Constitutional:      General: She is not in acute distress. Musculoskeletal:        General: No deformity.  Neurological:     Mental Status: She is alert and oriented to person, place, and time.     Coordination: Coordination normal.  Psychiatric:        Attention and Perception: Attention and perception normal. She does  not perceive auditory or visual hallucinations.        Mood and Affect: Affect is not labile, blunt, angry or inappropriate.        Speech: Speech normal.        Behavior: Behavior normal.        Thought Content: Thought content normal. Thought content is not paranoid or delusional. Thought content does not include homicidal or suicidal ideation. Thought content does not include homicidal or suicidal plan.        Cognition and Memory: Cognition and memory normal.        Judgment: Judgment normal.     Comments: Insight intact    Lab Review:  No results found for: NA, K, CL, CO2, GLUCOSE, BUN, CREATININE, CALCIUM, PROT, ALBUMIN,  AST, ALT, ALKPHOS, BILITOT, GFRNONAA, GFRAA  No results found for: WBC, RBC, HGB, HCT, PLT, MCV, MCH, MCHC, RDW, LYMPHSABS, MONOABS, EOSABS, BASOSABS  No results found for: POCLITH, LITHIUM   No results found for: PHENYTOIN, PHENOBARB, VALPROATE, CBMZ   .res Assessment: Plan:    Plan:  PDMP reviewed  Celexa  20mg  daily Increase Concerta  27mg  daily  BP 125/82  Increase Wellbutrin  XL 150mg  to 300mg  daily - denies seizure history  Monitor BP between visits while taking stimulant medication.   Will call [facility] for therapy  RTC 4 weeks  25 minutes spent dedicated to the care of this patient on the date of this encounter to include pre-visit review of records, ordering of medication, post visit documentation, and face-to-face time with the patient discussing ADD. Discussed continuing current medication regimen.  Patient advised to contact office with any questions, adverse effects, or acute worsening in signs and symptoms.  Discussed potential benefits, risks, and side effects of stimulants with patient to include increased heart rate, palpitations, insomnia, increased anxiety, increased irritability, or decreased appetite.    Instructed patient to contact office if experiencing any  significant tolerability issues.   There are no diagnoses linked to this encounter.   Please see After Visit Summary for patient specific instructions.  Future Appointments  Date Time Provider Department Center  06/26/2023 10:00 AM Sharrell Krawiec Nattalie, NP CP-CP None    No orders of the defined types were placed in this encounter.     -------------------------------

## 2023-07-02 DIAGNOSIS — D485 Neoplasm of uncertain behavior of skin: Secondary | ICD-10-CM | POA: Diagnosis not present

## 2023-07-02 DIAGNOSIS — H5213 Myopia, bilateral: Secondary | ICD-10-CM | POA: Diagnosis not present

## 2023-07-24 ENCOUNTER — Telehealth: Payer: BC Managed Care – PPO | Admitting: Adult Health

## 2023-08-01 ENCOUNTER — Encounter: Payer: Self-pay | Admitting: Adult Health

## 2023-08-01 ENCOUNTER — Telehealth: Admitting: Adult Health

## 2023-08-01 DIAGNOSIS — F909 Attention-deficit hyperactivity disorder, unspecified type: Secondary | ICD-10-CM | POA: Diagnosis not present

## 2023-08-01 DIAGNOSIS — F900 Attention-deficit hyperactivity disorder, predominantly inattentive type: Secondary | ICD-10-CM

## 2023-08-01 MED ORDER — METHYLPHENIDATE HCL ER (OSM) 27 MG PO TBCR: 27.0000 mg | EXTENDED_RELEASE_TABLET | ORAL | 0 refills | Status: DC

## 2023-08-01 NOTE — Progress Notes (Signed)
 Nicole Todd [id number] Jun 01, 1980 43 y.o.  Virtual Visit via Video Note  I connected with pt @ on 08/01/23 at  9:30 AM EDT by a video enabled telemedicine application and verified that I am speaking with the correct person using two identifiers.   I discussed the limitations of evaluation and management by telemedicine and the availability of in person appointments. The patient expressed understanding and agreed to proceed.  I discussed the assessment and treatment plan with the patient. The patient was provided an opportunity to ask questions and all were answered. The patient agreed with the plan and demonstrated an understanding of the instructions.   The patient was advised to call back or seek an in-person evaluation if the symptoms worsen or if the condition fails to improve as anticipated.  I provided 15 minutes of non-face-to-face time during this encounter.  The patient was located at home.  The provider was located at [facility].   Dorothyann Gibbs, NP   Subjective:   Patient ID:  Nicole Todd is a 43 y.o. (DOB Aug 18, 1980) female.  Chief Complaint: No chief complaint on file.   HPI Nicole Todd presents for follow-up of  ADHD.  Referred by PCP.  Describes mood today as "better". Pleasant. Denies tearfulness. Mood symptoms - denies depression, anxiety. Reports some irritability. Denies panic attacks. Denies worry, rumination and over thinking. Reports improved interest and motivation. Mood is "improving". Stating "I feel like I'm doing ok - still a little stuck - got to talk myself out of it". Feels like current medication is helpful. Did not report any changes with increase in the Wellbutrin. Taking medications as prescribed.  Energy levels improved. Active, does not have a regular exercise routine.  Enjoys some usual interests and activities. Divorced. Lives alone with 2 sons. Spending time with family. Appetite adequate. Weight stable.  Sleeps well most nights.  Averages 6 to 8 hours. Focus and concentration stable. Feels like the Concerta 27mg  daily is helpful. Completing tasks. Managing aspects of household. She is currently unemployed, but looking for a job. Denies SI or HI.  Denies AH or VH. Denies self harm. Denies substance use.  Looking for a therapist.   Previous medication trials: Celexa 20mg  daily  Review of Systems:  Review of Systems  Musculoskeletal:  Negative for gait problem.  Neurological:  Negative for tremors.  Psychiatric/Behavioral:         Please refer to HPI    Medications: I have reviewed the patient's current medications.  Current Outpatient Medications  Medication Sig Dispense Refill   buPROPion (WELLBUTRIN XL) 300 MG 24 hr tablet Take 1 tablet (300 mg total) by mouth daily. 30 tablet 2   Calcium Aspartate 75 MG TABS Take by mouth.     calcium carbonate (TUMS - DOSED IN MG ELEMENTAL CALCIUM) 500 MG chewable tablet Chew 1 tablet by mouth.     Cholecalciferol (VITAMIN D3) 2000 UNITS capsule Take by mouth.     citalopram (CELEXA) 20 MG tablet Take 1 tablet (20 mg total) by mouth daily. 90 tablet 1   famotidine (PEPCID) 10 MG tablet Take by mouth.     influenza vac split quadrivalent PF (FLUARIX) 0.5 ML injection Inject 0.5 mLs into the muscle. 0.5 mL 0   methylphenidate (CONCERTA) 27 MG PO CR tablet Take 1 tablet (27 mg total) by mouth every morning. 30 tablet 0   methylphenidate (CONCERTA) 27 MG PO CR tablet Take 1 tablet (27 mg total) by mouth every morning. 30 tablet 0  omeprazole (PRILOSEC) 20 MG capsule Take 20 mg by mouth.     polyethylene glycol-electrolytes (NULYTELY/GOLYTELY) 420 G solution MIX AND DRINK UTD  0   spironolactone (ALDACTONE) 100 MG tablet TAKE 1 TABLET BY MOUTH EVERY DAY WITH FOOD. DO NOT USE IF PREGNANT     valACYclovir (VALTREX) 500 MG tablet Take by mouth.     No current facility-administered medications for this visit.    Medication Side Effects: None  Allergies:  Allergies   Allergen Reactions   Meperidine    Meperidine Hcl Other (See Comments)    Past Medical History:  Diagnosis Date   GERD (gastroesophageal reflux disease)     No family history on file.  Social History   Socioeconomic History   Marital status: Unknown    Spouse name: Not on file   Number of children: Not on file   Years of education: Not on file   Highest education level: Not on file  Occupational History   Not on file  Tobacco Use   Smoking status: Never   Smokeless tobacco: Never  Substance and Sexual Activity   Alcohol use: No    Alcohol/week: 0.0 standard drinks of alcohol   Drug use: No   Sexual activity: Not on file  Other Topics Concern   Not on file  Social History Narrative   Not on file   Social Drivers of Health   Financial Resource Strain: Not on file  Food Insecurity: Not on file  Transportation Needs: Not on file  Physical Activity: Not on file  Stress: Not on file  Social Connections: Unknown (09/28/2021)   Received from [facility], [facility]   Social Network    Social Network: Not on file  Intimate Partner Violence: Unknown (08/23/2021)   Received from [facility], [facility]   HITS    Physically Hurt: Not on file    Insult or Talk Down To: Not on file    Threaten Physical Harm: Not on file    Scream or Curse: Not on file    Past Medical History, Surgical history, Social history, and Family history were reviewed and updated as appropriate.   Please see review of systems for further details on the patient's review from today.   Objective:   Physical Exam:  There were no vitals taken for this visit.  Physical Exam Constitutional:      General: She is not in acute distress. Musculoskeletal:        General: No deformity.  Neurological:     Mental Status: She is alert and oriented to person, place, and time.     Coordination: Coordination normal.  Psychiatric:        Attention and Perception: Attention and perception normal.  She does not perceive auditory or visual hallucinations.        Mood and Affect: Affect is not labile, blunt, angry or inappropriate.        Speech: Speech normal.        Behavior: Behavior normal.        Thought Content: Thought content normal. Thought content is not paranoid or delusional. Thought content does not include homicidal or suicidal ideation. Thought content does not include homicidal or suicidal plan.        Cognition and Memory: Cognition and memory normal.        Judgment: Judgment normal.     Comments: Insight intact   Lab Review:  No results found for: "NA", "K", "CL", "CO2", "GLUCOSE", "BUN", "CREATININE", "  CALCIUM", "PROT", "ALBUMIN", "AST", "ALT", "ALKPHOS", "BILITOT", "GFRNONAA", "GFRAA"  No results found for: "WBC", "RBC", "HGB", "HCT", "PLT", "MCV", "MCH", "MCHC", "RDW", "LYMPHSABS", "MONOABS", "EOSABS", "BASOSABS"  No results found for: "POCLITH", "LITHIUM"   No results found for: "PHENYTOIN", "PHENOBARB", "VALPROATE", "CBMZ"   .res Assessment: Plan:    Plan:  PDMP reviewed  Celexa 20mg  daily Concerta 27mg  daily Wellbutrin XL 300mg  daily - denies seizure history  BP 125/82  Monitor BP between visits while taking stimulant medication.   Will call [facility] for therapy  RTC 6 weeks  25 minutes spent dedicated to the care of this patient on the date of this encounter to include pre-visit review of records, ordering of medication, post visit documentation, and face-to-face time with the patient discussing ADD. Discussed continuing current medication regimen.  Patient advised to contact office with any questions, adverse effects, or acute worsening in signs and symptoms.  Discussed potential benefits, risks, and side effects of stimulants with patient to include increased heart rate, palpitations, insomnia, increased anxiety, increased irritability, or decreased appetite.    Instructed patient to contact office if experiencing any significant  tolerability issues.   There are no diagnoses linked to this encounter.   Please see After Visit Summary for patient specific instructions.  Future Appointments  Date Time Provider Department Center  08/01/2023  9:30 AM Valaree Fresquez, Thereasa Solo, NP CP-CP None    No orders of the defined types were placed in this encounter.     -------------------------------

## 2023-08-20 DIAGNOSIS — Z6834 Body mass index (BMI) 34.0-34.9, adult: Secondary | ICD-10-CM | POA: Diagnosis not present

## 2023-08-20 DIAGNOSIS — Z01419 Encounter for gynecological examination (general) (routine) without abnormal findings: Secondary | ICD-10-CM | POA: Diagnosis not present

## 2023-08-20 DIAGNOSIS — Z1231 Encounter for screening mammogram for malignant neoplasm of breast: Secondary | ICD-10-CM | POA: Diagnosis not present

## 2023-08-21 DIAGNOSIS — F900 Attention-deficit hyperactivity disorder, predominantly inattentive type: Secondary | ICD-10-CM | POA: Diagnosis not present

## 2023-08-21 DIAGNOSIS — Z1159 Encounter for screening for other viral diseases: Secondary | ICD-10-CM | POA: Diagnosis not present

## 2023-08-21 DIAGNOSIS — G43909 Migraine, unspecified, not intractable, without status migrainosus: Secondary | ICD-10-CM | POA: Diagnosis not present

## 2023-08-21 DIAGNOSIS — F411 Generalized anxiety disorder: Secondary | ICD-10-CM | POA: Diagnosis not present

## 2023-08-21 DIAGNOSIS — Z13220 Encounter for screening for lipoid disorders: Secondary | ICD-10-CM | POA: Diagnosis not present

## 2023-08-21 DIAGNOSIS — K219 Gastro-esophageal reflux disease without esophagitis: Secondary | ICD-10-CM | POA: Diagnosis not present

## 2023-08-22 ENCOUNTER — Other Ambulatory Visit: Payer: Self-pay

## 2023-08-22 ENCOUNTER — Telehealth: Payer: Self-pay | Admitting: Adult Health

## 2023-08-22 DIAGNOSIS — F900 Attention-deficit hyperactivity disorder, predominantly inattentive type: Secondary | ICD-10-CM

## 2023-08-22 NOTE — Telephone Encounter (Signed)
 Need to cancel Rx at [facility] [phone]. Repended to [facility].

## 2023-08-22 NOTE — Telephone Encounter (Signed)
 Pt lvm that she never got her march concerta. They don't have any in stock at [facility]. So she would like to have you cancel the march an April scrips and re send to [facility]

## 2023-08-23 ENCOUNTER — Other Ambulatory Visit (HOSPITAL_BASED_OUTPATIENT_CLINIC_OR_DEPARTMENT_OTHER): Payer: Self-pay

## 2023-08-23 MED ORDER — METHYLPHENIDATE HCL ER (OSM) 27 MG PO TBCR
27.0000 mg | EXTENDED_RELEASE_TABLET | ORAL | 0 refills | Status: DC
  Filled 2023-08-23 – 2023-09-30 (×2): qty 30, 30d supply, fill #0

## 2023-08-23 NOTE — Telephone Encounter (Signed)
 Called to cancel RF at [facility] and was told March Rx was picked up yesterday. They did cancel the April Rx.

## 2023-08-27 ENCOUNTER — Encounter: Payer: Self-pay | Admitting: Adult Health

## 2023-08-27 ENCOUNTER — Telehealth: Admitting: Adult Health

## 2023-08-27 DIAGNOSIS — F909 Attention-deficit hyperactivity disorder, unspecified type: Secondary | ICD-10-CM | POA: Diagnosis not present

## 2023-08-27 DIAGNOSIS — F900 Attention-deficit hyperactivity disorder, predominantly inattentive type: Secondary | ICD-10-CM

## 2023-08-27 MED ORDER — BUPROPION HCL ER (XL) 300 MG PO TB24: 300.0000 mg | ORAL_TABLET | Freq: Every day | ORAL | 2 refills | Status: DC

## 2023-08-27 NOTE — Progress Notes (Signed)
 Nicole Todd [id number] 02/17/1981 43 y.o.  Virtual Visit via Video Note  I connected with pt @ on 08/27/23 at  9:30 AM EDT by a video enabled telemedicine application and verified that I am speaking with the correct person using two identifiers.   I discussed the limitations of evaluation and management by telemedicine and the availability of in person appointments. The patient expressed understanding and agreed to proceed.  I discussed the assessment and treatment plan with the patient. The patient was provided an opportunity to ask questions and all were answered. The patient agreed with the plan and demonstrated an understanding of the instructions.   The patient was advised to call back or seek an in-person evaluation if the symptoms worsen or if the condition fails to improve as anticipated.  I provided 25 minutes of non-face-to-face time during this encounter.  The patient was located at home.  The provider was located at [facility].   Dorothyann Gibbs, NP   Subjective:   Patient ID:  Nicole Todd is a 43 y.o. (DOB [birth year]) female.  Chief Complaint: No chief complaint on file.   HPI Yu Cragun presents for follow-up of ADHD.  Referred by PCP.  Describes mood today as "improved". Pleasant. Denies tearfulness. Mood symptoms - denies depression, anxiety and irritability. Denies panic attacks. Denies worry, rumination and over thinking. Reports improved interest and motivation. Mood is more consistent. Stating "I feel like I'm getting better". Feels like current medication regimen is helpful. Taking medications as prescribed.  Energy levels improved. Active, has a regular exercise routine.  Enjoys some usual interests and activities. Divorced. Lives alone with 2 sons. Spending time with family. Appetite adequate. Weight stable.  Sleeps well most nights. Averages 6 to 8 hours. Focus and concentration stable. Completing tasks. Managing aspects of household. She is  currently unemployed, but looking for a job. Denies SI or HI.  Denies AH or VH. Denies self harm. Denies substance use.  Plans to see a therapist.   Previous medication trials: Celexa 20mg  daily  Review of Systems:  Review of Systems  Musculoskeletal:  Negative for gait problem.  Neurological:  Negative for tremors.  Psychiatric/Behavioral:         Please refer to HPI    Medications: I have reviewed the patient's current medications.  Current Outpatient Medications  Medication Sig Dispense Refill   buPROPion (WELLBUTRIN XL) 300 MG 24 hr tablet Take 1 tablet (300 mg total) by mouth daily. 30 tablet 2   Calcium Aspartate 75 MG TABS Take by mouth.     calcium carbonate (TUMS - DOSED IN MG ELEMENTAL CALCIUM) 500 MG chewable tablet Chew 1 tablet by mouth.     Cholecalciferol (VITAMIN D3) 2000 UNITS capsule Take by mouth.     citalopram (CELEXA) 20 MG tablet Take 1 tablet (20 mg total) by mouth daily. 90 tablet 1   famotidine (PEPCID) 10 MG tablet Take by mouth.     influenza vac split quadrivalent PF (FLUARIX) 0.5 ML injection Inject 0.5 mLs into the muscle. 0.5 mL 0   [START ON 08/29/2023] methylphenidate (CONCERTA) 27 MG PO CR tablet Take 1 tablet (27 mg total) by mouth every morning. 30 tablet 0   methylphenidate (CONCERTA) 27 MG PO CR tablet Take 1 tablet (27 mg total) by mouth every morning. 30 tablet 0   omeprazole (PRILOSEC) 20 MG capsule Take 20 mg by mouth.     polyethylene glycol-electrolytes (NULYTELY/GOLYTELY) 420 G solution MIX AND DRINK UTD  0  spironolactone (ALDACTONE) 100 MG tablet TAKE 1 TABLET BY MOUTH EVERY DAY WITH FOOD. DO NOT USE IF PREGNANT     valACYclovir (VALTREX) 500 MG tablet Take by mouth.     No current facility-administered medications for this visit.    Medication Side Effects: None  Allergies:  Allergies  Allergen Reactions   Meperidine    Meperidine Hcl Other (See Comments)    Past Medical History:  Diagnosis Date   GERD  (gastroesophageal reflux disease)     No family history on file.  Social History   Socioeconomic History   Marital status: Unknown    Spouse name: Not on file   Number of children: Not on file   Years of education: Not on file   Highest education level: Not on file  Occupational History   Not on file  Tobacco Use   Smoking status: Never   Smokeless tobacco: Never  Substance and Sexual Activity   Alcohol use: No    Alcohol/week: 0.0 standard drinks of alcohol   Drug use: No   Sexual activity: Not on file  Other Topics Concern   Not on file  Social History Narrative   Not on file   Social Drivers of Health   Financial Resource Strain: Not on file  Food Insecurity: Not on file  Transportation Needs: Not on file  Physical Activity: Not on file  Stress: Not on file  Social Connections: Unknown (09/28/2021)   Received from [facility], [facility]   Social Network    Social Network: Not on file  Intimate Partner Violence: Unknown (08/23/2021)   Received from [facility], [facility]   HITS    Physically Hurt: Not on file    Insult or Talk Down To: Not on file    Threaten Physical Harm: Not on file    Scream or Curse: Not on file    Past Medical History, Surgical history, Social history, and Family history were reviewed and updated as appropriate.   Please see review of systems for further details on the patient's review from today.   Objective:   Physical Exam:  There were no vitals taken for this visit.  Physical Exam Constitutional:      General: She is not in acute distress. Musculoskeletal:        General: No deformity.  Neurological:     Mental Status: She is alert and oriented to person, place, and time.     Coordination: Coordination normal.  Psychiatric:        Attention and Perception: Attention and perception normal. She does not perceive auditory or visual hallucinations.        Mood and Affect: Mood normal. Mood is not anxious or  depressed. Affect is not labile, blunt, angry or inappropriate.        Speech: Speech normal.        Behavior: Behavior normal.        Thought Content: Thought content normal. Thought content is not paranoid or delusional. Thought content does not include homicidal or suicidal ideation. Thought content does not include homicidal or suicidal plan.        Cognition and Memory: Cognition and memory normal.        Judgment: Judgment normal.     Comments: Insight intact    Lab Review:  No results found for: "NA", "K", "CL", "CO2", "GLUCOSE", "BUN", "CREATININE", "CALCIUM", "PROT", "ALBUMIN", "AST", "ALT", "ALKPHOS", "BILITOT", "GFRNONAA", "GFRAA"  No results found for: "WBC", "RBC", "HGB", "HCT", "PLT", "  MCV", "MCH", "MCHC", "RDW", "LYMPHSABS", "MONOABS", "EOSABS", "BASOSABS"  No results found for: "POCLITH", "LITHIUM"   No results found for: "PHENYTOIN", "PHENOBARB", "VALPROATE", "CBMZ"   .res Assessment: Plan:    Plan:  PDMP reviewed  Celexa 20mg  daily Concerta 27mg  daily Wellbutrin XL 300mg  daily - denies seizure history  Monitor BP between visits while taking stimulant medication.   Will call [facility] for therapy  RTC 6 weeks  25 minutes spent dedicated to the care of this patient on the date of this encounter to include pre-visit review of records, ordering of medication, post visit documentation, and face-to-face time with the patient discussing ADD. Discussed continuing current medication regimen.  Patient advised to contact office with any questions, adverse effects, or acute worsening in signs and symptoms.  Discussed potential benefits, risks, and side effects of stimulants with patient to include increased heart rate, palpitations, insomnia, increased anxiety, increased irritability, or decreased appetite.    Instructed patient to contact office if experiencing any significant tolerability issues.   There are no diagnoses linked to this encounter.   Please see  After Visit Summary for patient specific instructions.  Future Appointments  Date Time Provider Department Center  08/27/2023  9:30 AM Arval Brandstetter, Thereasa Solo, NP CP-CP None    No orders of the defined types were placed in this encounter.     -------------------------------

## 2023-09-23 DIAGNOSIS — M549 Dorsalgia, unspecified: Secondary | ICD-10-CM | POA: Diagnosis not present

## 2023-09-23 DIAGNOSIS — R5383 Other fatigue: Secondary | ICD-10-CM | POA: Diagnosis not present

## 2023-09-23 DIAGNOSIS — M7989 Other specified soft tissue disorders: Secondary | ICD-10-CM | POA: Diagnosis not present

## 2023-09-23 DIAGNOSIS — M545 Low back pain, unspecified: Secondary | ICD-10-CM | POA: Diagnosis not present

## 2023-09-23 DIAGNOSIS — R61 Generalized hyperhidrosis: Secondary | ICD-10-CM | POA: Diagnosis not present

## 2023-09-24 ENCOUNTER — Other Ambulatory Visit: Payer: Self-pay | Admitting: Family Medicine

## 2023-09-24 DIAGNOSIS — M7989 Other specified soft tissue disorders: Secondary | ICD-10-CM

## 2023-09-25 ENCOUNTER — Ambulatory Visit
Admission: RE | Admit: 2023-09-25 | Discharge: 2023-09-25 | Disposition: A | Discharge status: Home / Self Care | Source: Ambulatory Visit | Attending: Family Medicine | Admitting: Family Medicine

## 2023-09-25 ENCOUNTER — Other Ambulatory Visit: Payer: Self-pay | Admitting: Family Medicine

## 2023-09-25 DIAGNOSIS — M549 Dorsalgia, unspecified: Secondary | ICD-10-CM

## 2023-09-25 DIAGNOSIS — M545 Low back pain, unspecified: Secondary | ICD-10-CM | POA: Diagnosis not present

## 2023-09-30 ENCOUNTER — Other Ambulatory Visit: Payer: Self-pay | Admitting: Family Medicine

## 2023-09-30 ENCOUNTER — Other Ambulatory Visit (HOSPITAL_BASED_OUTPATIENT_CLINIC_OR_DEPARTMENT_OTHER): Payer: Self-pay

## 2023-09-30 ENCOUNTER — Other Ambulatory Visit

## 2023-09-30 ENCOUNTER — Other Ambulatory Visit: Payer: Self-pay

## 2023-09-30 ENCOUNTER — Telehealth: Payer: Self-pay | Admitting: Adult Health

## 2023-09-30 DIAGNOSIS — M7989 Other specified soft tissue disorders: Secondary | ICD-10-CM

## 2023-09-30 NOTE — Telephone Encounter (Signed)
 Nicole Todd called at 1:15 to request refill of her Concerta .  She said last fill was 08/22/23 according the bottle she had.  She had called [facility] to see if they had a prescription and they told her they did NOT have one.   Appt 5/20.  Send to [facility]

## 2023-10-04 ENCOUNTER — Ambulatory Visit
Admission: RE | Admit: 2023-10-04 | Discharge: 2023-10-04 | Disposition: A | Discharge status: Home / Self Care | Source: Ambulatory Visit | Attending: Family Medicine | Admitting: Family Medicine

## 2023-10-04 DIAGNOSIS — M7989 Other specified soft tissue disorders: Secondary | ICD-10-CM

## 2023-10-08 ENCOUNTER — Encounter: Payer: Self-pay | Admitting: Adult Health

## 2023-10-08 ENCOUNTER — Telehealth (INDEPENDENT_AMBULATORY_CARE_PROVIDER_SITE_OTHER): Admitting: Adult Health

## 2023-10-08 DIAGNOSIS — F900 Attention-deficit hyperactivity disorder, predominantly inattentive type: Secondary | ICD-10-CM | POA: Diagnosis not present

## 2023-10-08 MED ORDER — METHYLPHENIDATE HCL ER (OSM) 27 MG PO TBCR: 27.0000 mg | EXTENDED_RELEASE_TABLET | ORAL | 0 refills | Status: DC

## 2023-10-08 NOTE — Progress Notes (Signed)
 Nicole Todd [id number] 1980/07/01 43 y.o.  Virtual Visit via Video Note  I connected with pt @ on 10/08/23 at 11:00 AM EDT by a video enabled telemedicine application and verified that I am speaking with the correct person using two identifiers.   I discussed the limitations of evaluation and management by telemedicine and the availability of in person appointments. The patient expressed understanding and agreed to proceed.  I discussed the assessment and treatment plan with the patient. The patient was provided an opportunity to ask questions and all were answered. The patient agreed with the plan and demonstrated an understanding of the instructions.   The patient was advised to call back or seek an in-person evaluation if the symptoms worsen or if the condition fails to improve as anticipated.  I provided 25 minutes of non-face-to-face time during this encounter.  The patient was located at home.  The provider was located at [facility].   Reagan Camera, NP   Subjective:   Patient ID:  Nicole Todd is a 43 y.o. (DOB September 08, 1980) female.  Chief Complaint: No chief complaint on file.   HPI Nicole Todd presents for follow-up of ADHD.  Referred by PCP.  Describes mood today as "improving". Pleasant. Denies tearfulness. Mood symptoms - denies depression, anxiety and irritability. Reports improved interest and motivation. Denies panic attacks. Denies worry, rumination and over thinking.  Reports mood is stable. Stating "I feel like I'm doing better". Feels like current medication regimen is helpful. Taking medications as prescribed.  Energy levels improved. Active, has a regular exercise routine.  Enjoys some usual interests and activities. Divorced. Lives alone with 2 sons. Spending time with family. Appetite adequate. Weight stable.  Sleeps well most nights. Averages 6 to 8 or more hours. Focus and concentration stable with Concerta . Completing tasks. Managing aspects of  household. 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 Denies SI or HI.  Denies AH or VH. Denies self harm. Denies substance use.  Plans to see a therapist.   Previous medication trials: Celexa  20mg  daily  Review of Systems:  Review of Systems  Musculoskeletal:  Negative for gait problem.  Neurological:  Negative for tremors.  Psychiatric/Behavioral:         Please refer to HPI    Medications: I have reviewed the patient's current medications.  Current Outpatient Medications  Medication Sig Dispense Refill   Calcium Aspartate 75 MG TABS Take by mouth.     calcium carbonate (TUMS - DOSED IN MG ELEMENTAL CALCIUM) 500 MG chewable tablet Chew 1 tablet by mouth.     Cholecalciferol (VITAMIN D3) 2000 UNITS capsule Take by mouth.     citalopram  (CELEXA ) 20 MG tablet Take 1 tablet (20 mg total) by mouth daily. 90 tablet 1   famotidine (PEPCID) 10 MG tablet Take by mouth.     influenza vac split quadrivalent PF (FLUARIX) 0.5 ML injection Inject 0.5 mLs into the muscle. 0.5 mL 0   [START ON 11/05/2023] methylphenidate  (CONCERTA ) 27 MG PO CR tablet Take 1 tablet (27 mg total) by mouth every morning. 30 tablet 0   methylphenidate  (CONCERTA ) 27 MG PO CR tablet Take 1 tablet (27 mg total) by mouth every morning. 30 tablet 0   omeprazole (PRILOSEC) 20 MG capsule Take 20 mg by mouth.     polyethylene glycol-electrolytes (NULYTELY/GOLYTELY) 420 G solution MIX AND DRINK UTD  0   spironolactone (ALDACTONE) 100 MG tablet TAKE 1 TABLET BY MOUTH EVERY DAY WITH FOOD. DO  NOT USE IF PREGNANT     valACYclovir (VALTREX) 500 MG tablet Take by mouth.     No current facility-administered medications for this visit.    Medication Side Effects: None  Allergies:  Allergies  Allergen Reactions   Meperidine    Meperidine Hcl Other (See Comments)    Past Medical History:  Diagnosis Date   GERD (gastroesophageal reflux disease)     No family history on file.  Social History    Socioeconomic History   Marital status: Unknown    Spouse name: Not on file   Number of children: Not on file   Years of education: Not on file   Highest education level: Not on file  Occupational History   Not on file  Tobacco Use   Smoking status: Never   Smokeless tobacco: Never  Substance and Sexual Activity   Alcohol use: No    Alcohol/week: 0.0 standard drinks of alcohol   Drug use: No   Sexual activity: Not on file  Other Topics Concern   Not on file  Social History Narrative   Not on file   Social Drivers of Health   Financial Resource Strain: Not on file  Food Insecurity: Not on file  Transportation Needs: Not on file  Physical Activity: Not on file  Stress: Not on file  Social Connections: Unknown (09/28/2021)   Received from [facility]   Social Network    Social Network: Not on file  Intimate Partner Violence: Unknown (08/23/2021)   Received from [facility], [facility]   HITS    Physically Hurt: Not on file    Insult or Talk Down To: Not on file    Threaten Physical Harm: Not on file    Scream or Curse: Not on file    Past Medical History, Surgical history, Social history, and Family history were reviewed and updated as appropriate.   Please see review of systems for further details on the patient's review from today.   Objective:   Physical Exam:  There were no vitals taken for this visit.  Physical Exam Constitutional:      General: She is not in acute distress. Musculoskeletal:        General: No deformity.  Neurological:     Mental Status: She is alert and oriented to person, place, and time.     Coordination: Coordination normal.  Psychiatric:        Attention and Perception: Attention and perception normal. She does not perceive auditory or visual hallucinations.        Mood and Affect: Mood normal. Mood is not anxious or depressed. Affect is not labile, blunt, angry or inappropriate.        Speech: Speech normal.         Behavior: Behavior normal.        Thought Content: Thought content normal. Thought content is not paranoid or delusional. Thought content does not include homicidal or suicidal ideation. Thought content does not include homicidal or suicidal plan.        Cognition and Memory: Cognition and memory normal.        Judgment: Judgment normal.     Comments: Insight intact     Lab Review:  No results found for: "NA", "K", "CL", "CO2", "GLUCOSE", "BUN", "CREATININE", "CALCIUM", "PROT", "ALBUMIN", "AST", "ALT", "ALKPHOS", "BILITOT", "GFRNONAA", "GFRAA"  No results found for: "WBC", "RBC", "HGB", "HCT", "PLT", "MCV", "MCH", "MCHC", "RDW", "LYMPHSABS", "MONOABS", "EOSABS", "BASOSABS"  No results found for: "POCLITH", "  LITHIUM"   No results found for: "PHENYTOIN", "PHENOBARB", "VALPROATE", "CBMZ"   .res Assessment: Plan:    Plan:  PDMP reviewed  Celexa  20mg  daily - discussed tapering off medication.  Concerta  27mg  daily  D/C Wellbutrin  XL 300mg  daily - denies seizure history - stopped taking between visits.  Monitor BP between visits while taking stimulant medication.   RTC 6 weeks  25 minutes spent dedicated to the care of this patient on the date of this encounter to include pre-visit review of records, ordering of medication, post visit documentation, and face-to-face time with the patient discussing ADD. Discussed continuing current medication regimen.  Patient advised to contact office with any questions, adverse effects, or acute worsening in signs and symptoms.  Discussed potential benefits, risks, and side effects of stimulants with patient to include increased heart rate, palpitations, insomnia, increased anxiety, increased irritability, or decreased appetite.    Instructed patient to contact office if experiencing any significant tolerability issues.   Diagnoses and all orders for this visit:  Attention deficit hyperactivity disorder (ADHD), predominantly inattentive  type -     methylphenidate  (CONCERTA ) 27 MG PO CR tablet; Take 1 tablet (27 mg total) by mouth every morning. -     methylphenidate  (CONCERTA ) 27 MG PO CR tablet; Take 1 tablet (27 mg total) by mouth every morning.     Please see After Visit Summary for patient specific instructions.  No future appointments.   No orders of the defined types were placed in this encounter.     -------------------------------

## 2023-10-21 ENCOUNTER — Other Ambulatory Visit: Payer: Self-pay

## 2023-10-21 ENCOUNTER — Telehealth: Payer: Self-pay | Admitting: Adult Health

## 2023-10-21 DIAGNOSIS — I872 Venous insufficiency (chronic) (peripheral): Secondary | ICD-10-CM

## 2023-10-21 NOTE — Telephone Encounter (Signed)
 Please see message from patient in regards to tapering off Citalopram  and advise.

## 2023-10-21 NOTE — Telephone Encounter (Signed)
 Patient reporting no SE. She said she was tired for a couple of days but now reporting feeling more energy, more motivation, and mood improved. She will taper down to 5 mg.

## 2023-10-21 NOTE — Telephone Encounter (Signed)
 Patient called regarding Citalopram . States that she has started to taper off from taking going from 20mg  to 15mg  and than to 10mg . She asked if she is supposed to taper down to 5mg  next or stop completely. PH: [phone] Appt 7/1

## 2023-11-02 ENCOUNTER — Other Ambulatory Visit: Payer: Self-pay | Admitting: Adult Health

## 2023-11-02 ENCOUNTER — Other Ambulatory Visit (HOSPITAL_BASED_OUTPATIENT_CLINIC_OR_DEPARTMENT_OTHER): Payer: Self-pay

## 2023-11-02 DIAGNOSIS — F900 Attention-deficit hyperactivity disorder, predominantly inattentive type: Secondary | ICD-10-CM

## 2023-11-03 NOTE — Telephone Encounter (Signed)
 Has RF at [facility].

## 2023-11-05 ENCOUNTER — Other Ambulatory Visit (HOSPITAL_BASED_OUTPATIENT_CLINIC_OR_DEPARTMENT_OTHER): Payer: Self-pay

## 2023-11-07 ENCOUNTER — Ambulatory Visit (HOSPITAL_COMMUNITY)
Admission: RE | Admit: 2023-11-07 | Discharge: 2023-11-07 | Disposition: A | Discharge status: Home / Self Care | Source: Ambulatory Visit | Attending: Vascular Surgery | Admitting: Vascular Surgery

## 2023-11-07 DIAGNOSIS — I872 Venous insufficiency (chronic) (peripheral): Secondary | ICD-10-CM

## 2023-11-19 ENCOUNTER — Telehealth: Admitting: Adult Health

## 2023-11-19 ENCOUNTER — Encounter: Payer: Self-pay | Admitting: Adult Health

## 2023-11-19 DIAGNOSIS — F900 Attention-deficit hyperactivity disorder, predominantly inattentive type: Secondary | ICD-10-CM

## 2023-11-19 MED ORDER — METHYLPHENIDATE HCL ER (OSM) 27 MG PO TBCR: 27.0000 mg | EXTENDED_RELEASE_TABLET | ORAL | 0 refills | Status: DC

## 2023-11-19 NOTE — Progress Notes (Signed)
 Nicole Todd [id number] [birth year] 43 y.o.  Virtual Visit via Video Note  I connected with pt @ on 11/19/23 at  9:30 AM EDT by a video enabled telemedicine application and verified that I am speaking with the correct person using two identifiers.   I discussed the limitations of evaluation and management by telemedicine and the availability of in person appointments. The patient expressed understanding and agreed to proceed.  I discussed the assessment and treatment plan with the patient. The patient was provided an opportunity to ask questions and all were answered. The patient agreed with the plan and demonstrated an understanding of the instructions.   The patient was advised to call back or seek an in-person evaluation if the symptoms worsen or if the condition fails to improve as anticipated.  I provided 20 minutes of non-face-to-face time during this encounter.  The patient was located at home.  The provider was located at [facility].   Angeline LOISE Sayers, NP   Subjective:   Patient ID:  Nicole Todd is a 43 y.o. (DOB 1980/11/17) female.  Chief Complaint: No chief complaint on file.   HPI Nicole Todd presents for follow-up of ADHD.  Referred by PCP.  Describes mood today as ok. Pleasant. Denies tearfulness. Mood symptoms - denies depression, anxiety and irritability. Reports improved interest and motivation. Denies panic attacks. Denies worry, rumination and over thinking. Reports mood is stable. Stating I feel like I'm doing ok. Feels like current medication regimen is helpful. Taking medications as prescribed.  Energy levels stable. Active, has a regular exercise routine - started playing tennis again.  Enjoys some usual interests and activities. Divorced. Lives alone with 2 sons. Spending time with family. Appetite adequate. Weight stable.  Sleeps well most nights. Averages 6 to 8 or more hours. Focus and concentration stable with Concerta . Completing tasks.  Managing aspects of household. 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 Denies SI or HI.  Denies AH or VH. Denies self harm. Denies substance use.  Plans to see a therapist.   Previous medication trials: Celexa  20mg  daily  Review of Systems:  Review of Systems  Musculoskeletal:  Negative for gait problem.  Neurological:  Negative for tremors.  Psychiatric/Behavioral:         Please refer to HPI    Medications: I have reviewed the patient's current medications.  Current Outpatient Medications  Medication Sig Dispense Refill   Calcium Aspartate 75 MG TABS Take by mouth.     calcium carbonate (TUMS - DOSED IN MG ELEMENTAL CALCIUM) 500 MG chewable tablet Chew 1 tablet by mouth.     Cholecalciferol (VITAMIN D3) 2000 UNITS capsule Take by mouth.     famotidine (PEPCID) 10 MG tablet Take by mouth.     influenza vac split quadrivalent PF (FLUARIX) 0.5 ML injection Inject 0.5 mLs into the muscle. 0.5 mL 0   methylphenidate  (CONCERTA ) 27 MG PO CR tablet Take 1 tablet (27 mg total) by mouth every morning. 30 tablet 0   [START ON 12/17/2023] methylphenidate  (CONCERTA ) 27 MG PO CR tablet Take 1 tablet (27 mg total) by mouth every morning. 30 tablet 0   omeprazole (PRILOSEC) 20 MG capsule Take 20 mg by mouth.     polyethylene glycol-electrolytes (NULYTELY/GOLYTELY) 420 G solution MIX AND DRINK UTD  0   spironolactone (ALDACTONE) 100 MG tablet TAKE 1 TABLET BY MOUTH EVERY DAY WITH FOOD. DO NOT USE IF PREGNANT     valACYclovir (VALTREX)  500 MG tablet Take by mouth.     No current facility-administered medications for this visit.    Medication Side Effects: None  Allergies:  Allergies  Allergen Reactions   Meperidine    Meperidine Hcl Other (See Comments)    Past Medical History:  Diagnosis Date   GERD (gastroesophageal reflux disease)     No family history on file.  Social History   Socioeconomic History   Marital status: Unknown    Spouse name: Not  on file   Number of children: Not on file   Years of education: Not on file   Highest education level: Not on file  Occupational History   Not on file  Tobacco Use   Smoking status: Never   Smokeless tobacco: Never  Substance and Sexual Activity   Alcohol use: No    Alcohol/week: 0.0 standard drinks of alcohol   Drug use: No   Sexual activity: Not on file  Other Topics Concern   Not on file  Social History Narrative   Not on file   Social Drivers of Health   Financial Resource Strain: Not on file  Food Insecurity: Not on file  Transportation Needs: Not on file  Physical Activity: Not on file  Stress: Not on file  Social Connections: Unknown (09/28/2021)   Received from [facility]   Social Network    Social Network: Not on file  Intimate Partner Violence: Unknown (08/23/2021)   Received from [facility]   HITS    Physically Hurt: Not on file    Insult or Talk Down To: Not on file    Threaten Physical Harm: Not on file    Scream or Curse: Not on file    Past Medical History, Surgical history, Social history, and Family history were reviewed and updated as appropriate.   Please see review of systems for further details on the patient's review from today.   Objective:   Physical Exam:  There were no vitals taken for this visit.  Physical Exam Constitutional:      General: She is not in acute distress.  Musculoskeletal:        General: No deformity.   Neurological:     Mental Status: She is alert and oriented to person, place, and time.     Coordination: Coordination normal.   Psychiatric:        Attention and Perception: Attention and perception normal. She does not perceive auditory or visual hallucinations.        Mood and Affect: Mood normal. Mood is not anxious or depressed. Affect is not labile, blunt, angry or inappropriate.        Speech: Speech normal.        Behavior: Behavior normal.        Thought Content: Thought content normal. Thought  content is not paranoid or delusional. Thought content does not include homicidal or suicidal ideation. Thought content does not include homicidal or suicidal plan.        Cognition and Memory: Cognition and memory normal.        Judgment: Judgment normal.     Comments: Insight intact     Lab Review:  No results found for: NA, K, CL, CO2, GLUCOSE, BUN, CREATININE, CALCIUM, PROT, ALBUMIN, AST, ALT, ALKPHOS, BILITOT, GFRNONAA, GFRAA  No results found for: WBC, RBC, HGB, HCT, PLT, MCV, MCH, MCHC, RDW, LYMPHSABS, MONOABS, EOSABS, BASOSABS  No results found for: POCLITH, LITHIUM   No results found for: PHENYTOIN, PHENOBARB, VALPROATE, CBMZ   .  res Assessment: Plan:    Plan:  PDMP reviewed  Continue Concerta  27mg  daily  Discontinue Celexa  20mg  daily   Discontinue Wellbutrin  XL 300mg  daily - denies seizure history    Monitor BP between visits while taking stimulant medication.   RTC 6 weeks  20 minutes spent dedicated to the care of this patient on the date of this encounter to include pre-visit review of records, ordering of medication, post visit documentation, and face-to-face time with the patient discussing ADD. Discussed continuing current medication regimen.  Patient advised to contact office with any questions, adverse effects, or acute worsening in signs and symptoms.  Discussed potential benefits, risks, and side effects of stimulants with patient to include increased heart rate, palpitations, insomnia, increased anxiety, increased irritability, or decreased appetite.    Instructed patient to contact office if experiencing any significant tolerability issues.    Diagnoses and all orders for this visit:  Attention deficit hyperactivity disorder (ADHD), predominantly inattentive type -     methylphenidate  (CONCERTA ) 27 MG PO CR tablet; Take 1 tablet (27 mg total) by mouth every morning. -      methylphenidate  (CONCERTA ) 27 MG PO CR tablet; Take 1 tablet (27 mg total) by mouth every morning.     Please see After Visit Summary for patient specific instructions.  Future Appointments  Date Time Provider Department Center  12/17/2023  9:30 AM [facility]    No orders of the defined types were placed in this encounter.     -------------------------------

## 2023-12-12 DIAGNOSIS — L239 Allergic contact dermatitis, unspecified cause: Secondary | ICD-10-CM | POA: Diagnosis not present

## 2023-12-12 DIAGNOSIS — L237 Allergic contact dermatitis due to plants, except food: Secondary | ICD-10-CM | POA: Diagnosis not present

## 2023-12-17 ENCOUNTER — Ambulatory Visit: Attending: Vascular Surgery | Admitting: Physician Assistant

## 2023-12-17 VITALS — BP 106/70 | HR 76 | Temp 97.9°F | Wt 203.2 lb

## 2023-12-17 DIAGNOSIS — R600 Localized edema: Secondary | ICD-10-CM | POA: Diagnosis not present

## 2023-12-17 NOTE — Progress Notes (Signed)
 Office Note     CC:  follow up Requesting Provider:  Auston Opal, DO  HPI: Nicole Todd is a 43 y.o. (16-Dec-1980) female who presents for evaluation of bilateral lower extremity edema left worse than right.  She started noticing swollen ankles since she had her last child 11 years ago.  Over the past year or 2 she believes her swelling does not completely resolve overnight.  She denies any history of DVT, venous ulcerations, trauma, or prior vascular interventions.  She states her mother had large ropey varicosities and had a vein stripping procedure in the past.  She tries to elevate her legs when possible during the day.  Her job requires her to be on her feet a lot or behind a desk throughout the day.  She does not wear compression to work.  She denies tobacco use.   Past Medical History:  Diagnosis Date   GERD (gastroesophageal reflux disease)     History reviewed. No pertinent surgical history.  Social History   Socioeconomic History   Marital status: Unknown    Spouse name: Not on file   Number of children: Not on file   Years of education: Not on file   Highest education level: Not on file  Occupational History   Not on file  Tobacco Use   Smoking status: Never   Smokeless tobacco: Never  Substance and Sexual Activity   Alcohol use: No    Alcohol/week: 0.0 standard drinks of alcohol   Drug use: No   Sexual activity: Not on file  Other Topics Concern   Not on file  Social History Narrative   Not on file   Social Drivers of Health   Financial Resource Strain: Not on file  Food Insecurity: Not on file  Transportation Needs: Not on file  Physical Activity: Not on file  Stress: Not on file  Social Connections: Unknown (09/28/2021)   Received from [facility]   Social Network    Social Network: Not on file  Intimate Partner Violence: Unknown (08/23/2021)   Received from [facility]   HITS    Physically Hurt: Not on file    Insult or Talk Down To: Not on file     Threaten Physical Harm: Not on file    Scream or Curse: Not on file   History reviewed. No pertinent family history.  Current Outpatient Medications  Medication Sig Dispense Refill   Calcium Aspartate 75 MG TABS Take by mouth.     calcium carbonate (TUMS - DOSED IN MG ELEMENTAL CALCIUM) 500 MG chewable tablet Chew 1 tablet by mouth.     Cholecalciferol (VITAMIN D3) 2000 UNITS capsule Take by mouth.     famotidine (PEPCID) 10 MG tablet Take by mouth.     influenza vac split quadrivalent PF (FLUARIX) 0.5 ML injection Inject 0.5 mLs into the muscle. 0.5 mL 0   methylphenidate  (CONCERTA ) 27 MG PO CR tablet Take 1 tablet (27 mg total) by mouth every morning. 30 tablet 0   methylphenidate  (CONCERTA ) 27 MG PO CR tablet Take 1 tablet (27 mg total) by mouth every morning. 30 tablet 0   omeprazole (PRILOSEC) 20 MG capsule Take 20 mg by mouth.     polyethylene glycol-electrolytes (NULYTELY/GOLYTELY) 420 G solution MIX AND DRINK UTD  0   spironolactone (ALDACTONE) 100 MG tablet TAKE 1 TABLET BY MOUTH EVERY DAY WITH FOOD. DO NOT USE IF PREGNANT     valACYclovir (VALTREX) 500 MG tablet Take by mouth.  No current facility-administered medications for this visit.    Allergies  Allergen Reactions   Meperidine    Meperidine Hcl Other (See Comments)     REVIEW OF SYSTEMS:   [X]  denotes positive finding, [ ]  denotes negative finding Cardiac  Comments:  Chest pain or chest pressure:    Shortness of breath upon exertion:    Short of breath when lying flat:    Irregular heart rhythm:        Vascular    Pain in calf, thigh, or hip brought on by ambulation:    Pain in feet at night that wakes you up from your sleep:     Blood clot in your veins:    Leg swelling:         Pulmonary    Oxygen at home:    Productive cough:     Wheezing:         Neurologic    Sudden weakness in arms or legs:     Sudden numbness in arms or legs:     Sudden onset of difficulty speaking or slurred speech:     Temporary loss of vision in one eye:     Problems with dizziness:         Gastrointestinal    Blood in stool:     Vomited blood:         Genitourinary    Burning when urinating:     Blood in urine:        Psychiatric    Major depression:         Hematologic    Bleeding problems:    Problems with blood clotting too easily:        Skin    Rashes or ulcers:        Constitutional    Fever or chills:      PHYSICAL EXAMINATION:  Vitals:   12/17/23 0928  BP: 106/70  Pulse: 76  Temp: 97.9 F (36.6 C)  TempSrc: Temporal  Weight: 203 lb 3.2 oz (92.2 kg)    General:  WDWN in NAD; vital signs documented above Gait: Not observed HENT: WNL, normocephalic Pulmonary: normal non-labored breathing Cardiac: regular HR Abdomen: soft, NT, no masses Skin: without rashes Vascular Exam/Pulses: palpable DP pulses Extremities: without ischemic changes, without Gangrene , without cellulitis; without open wounds; no significant edema BLE; no venous ulcers or spider veins Musculoskeletal: no muscle wasting or atrophy  Neurologic: A&O X 3 Psychiatric:  The pt has Normal affect.   Non-Invasive Vascular Imaging:   Left lower extremity venous reflux study negative for DVT Negative for superficial venous reflux Negative for deep venous reflux    ASSESSMENT/PLAN:: 43 y.o. female here for evaluation of bilateral lower extremity edema left worse than right  [name] is a 43 year old female who complains of worsening edema in both legs left worse than right.  She believes swelling does not completely resolve overnight like it used to.  She does not wear compression however tries to elevate her legs when possible during the day.  She has no history of DVT.  Left lower extremity venous reflux study was negative for DVT.  Study was also negative for deep or superficial venous reflux.  Recommendations include regular use of knee-high compression especially during working hours.  We also  discussed proper leg elevation when possible during the day especially towards the end of the day.  She should also try to avoid prolonged sitting and standing.  I also recommended she  discuss as needed use of a diuretic with her PCP.  Nothing further from a vascular standpoint to offer.  She can follow-up on an as-needed basis.   Nicole Sender, PA-C [facility] [phone]  Clinic MD:   Magda

## 2023-12-30 ENCOUNTER — Encounter: Payer: Self-pay | Admitting: Adult Health

## 2023-12-30 ENCOUNTER — Telehealth: Admitting: Adult Health

## 2023-12-30 DIAGNOSIS — F900 Attention-deficit hyperactivity disorder, predominantly inattentive type: Secondary | ICD-10-CM

## 2023-12-30 DIAGNOSIS — F909 Attention-deficit hyperactivity disorder, unspecified type: Secondary | ICD-10-CM

## 2023-12-30 MED ORDER — METHYLPHENIDATE HCL ER (OSM) 27 MG PO TBCR: 27.0000 mg | EXTENDED_RELEASE_TABLET | ORAL | 0 refills | Status: DC

## 2023-12-30 NOTE — Progress Notes (Signed)
 Nicole Todd [id number] 1980/06/02 43 y.o.  Virtual Visit via Video Note  I connected with pt @ on 12/30/23 at 11:00 AM EDT by a video enabled telemedicine application and verified that I am speaking with the correct person using two identifiers.   I discussed the limitations of evaluation and management by telemedicine and the availability of in person appointments. The patient expressed understanding and agreed to proceed.  I discussed the assessment and treatment plan with the patient. The patient was provided an opportunity to ask questions and all were answered. The patient agreed with the plan and demonstrated an understanding of the instructions.   The patient was advised to call back or seek an in-person evaluation if the symptoms worsen or if the condition fails to improve as anticipated.  I provided 25 minutes of non-face-to-face time during this encounter.  The patient was located at home.  The provider was located at [facility].   Angeline LOISE Sayers, NP   Subjective:   Patient ID:  Nicole Todd is a 43 y.o. (DOB [birth year]) female.  Chief Complaint: No chief complaint on file.   HPI Nicole Todd presents for follow-up of ADHD.  Referred by PCP.  Describes mood today as ok. Pleasant. Denies tearfulness. Mood symptoms - denies depression, anxiety and irritability. Reports stable interest and motivation. Denies panic attacks. Denies worry, rumination and over thinking. Reports situational stressors. Reports mood is stable. Stating I feel like I'm doing good. Feels like current medication regimen is helpful. Taking medications as prescribed.  Energy levels stable. Active, has a regular exercise routine - started playing tennis again.  Enjoys some usual interests and activities. Divorced. Lives alone with 2 sons. Spending time with family. Appetite adequate. Weight loss.  Sleeps well most nights. Averages 4 to 5 hours of broken sleep over past few weeks - more  situational. Focus and concentration stable with Concerta . Completing tasks. Managing aspects of household. 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 Denies SI or HI.  Denies AH or VH. Denies self harm. Denies substance use.  Plans to see a therapist.   Previous medication trials: Celexa  20mg  daily   Review of Systems:  Review of Systems  Musculoskeletal:  Negative for gait problem.  Neurological:  Negative for tremors.  Psychiatric/Behavioral:         Please refer to HPI    Medications: I have reviewed the patient's current medications.  Current Outpatient Medications  Medication Sig Dispense Refill   Calcium Aspartate 75 MG TABS Take by mouth.     calcium carbonate (TUMS - DOSED IN MG ELEMENTAL CALCIUM) 500 MG chewable tablet Chew 1 tablet by mouth.     Cholecalciferol (VITAMIN D3) 2000 UNITS capsule Take by mouth.     famotidine (PEPCID) 10 MG tablet Take by mouth.     influenza vac split quadrivalent PF (FLUARIX) 0.5 ML injection Inject 0.5 mLs into the muscle. 0.5 mL 0   methylphenidate  (CONCERTA ) 27 MG PO CR tablet Take 1 tablet (27 mg total) by mouth every morning. 30 tablet 0   methylphenidate  (CONCERTA ) 27 MG PO CR tablet Take 1 tablet (27 mg total) by mouth every morning. 30 tablet 0   omeprazole (PRILOSEC) 20 MG capsule Take 20 mg by mouth.     polyethylene glycol-electrolytes (NULYTELY/GOLYTELY) 420 G solution MIX AND DRINK UTD  0   spironolactone (ALDACTONE) 100 MG tablet TAKE 1 TABLET BY MOUTH EVERY DAY WITH FOOD. DO NOT USE IF PREGNANT  valACYclovir (VALTREX) 500 MG tablet Take by mouth.     No current facility-administered medications for this visit.    Medication Side Effects: None  Allergies:  Allergies  Allergen Reactions   Meperidine    Meperidine Hcl Other (See Comments)    Past Medical History:  Diagnosis Date   GERD (gastroesophageal reflux disease)     No family history on file.  Social History   Socioeconomic History    Marital status: Unknown    Spouse name: Not on file   Number of children: Not on file   Years of education: Not on file   Highest education level: Not on file  Occupational History   Not on file  Tobacco Use   Smoking status: Never   Smokeless tobacco: Never  Substance and Sexual Activity   Alcohol use: No    Alcohol/week: 0.0 standard drinks of alcohol   Drug use: No   Sexual activity: Not on file  Other Topics Concern   Not on file  Social History Narrative   Not on file   Social Drivers of Health   Financial Resource Strain: Not on file  Food Insecurity: Not on file  Transportation Needs: Not on file  Physical Activity: Not on file  Stress: Not on file  Social Connections: Unknown (09/28/2021)   Received from [facility]   Social Network    Social Network: Not on file  Intimate Partner Violence: Unknown (08/23/2021)   Received from [facility]   HITS    Physically Hurt: Not on file    Insult or Talk Down To: Not on file    Threaten Physical Harm: Not on file    Scream or Curse: Not on file    Past Medical History, Surgical history, Social history, and Family history were reviewed and updated as appropriate.   Please see review of systems for further details on the patient's review from today.   Objective:   Physical Exam:  LMP 12/09/2023 (Approximate)   Physical Exam Constitutional:      General: She is not in acute distress. Musculoskeletal:        General: No deformity.  Neurological:     Mental Status: She is alert and oriented to person, place, and time.     Coordination: Coordination normal.  Psychiatric:        Attention and Perception: Attention and perception normal. She does not perceive auditory or visual hallucinations.        Mood and Affect: Mood normal. Mood is not anxious or depressed. Affect is not labile, blunt, angry or inappropriate.        Speech: Speech normal.        Behavior: Behavior normal.        Thought Content: Thought  content normal. Thought content is not paranoid or delusional. Thought content does not include homicidal or suicidal ideation. Thought content does not include homicidal or suicidal plan.        Cognition and Memory: Cognition and memory normal.        Judgment: Judgment normal.     Comments: Insight intact     Lab Review:  No results found for: NA, K, CL, CO2, GLUCOSE, BUN, CREATININE, CALCIUM, PROT, ALBUMIN, AST, ALT, ALKPHOS, BILITOT, GFRNONAA, GFRAA  No results found for: WBC, RBC, HGB, HCT, PLT, MCV, MCH, MCHC, RDW, LYMPHSABS, MONOABS, EOSABS, BASOSABS  No results found for: POCLITH, LITHIUM   No results found for: PHENYTOIN, PHENOBARB, VALPROATE, CBMZ   .res Assessment: Plan:  Plan:  PDMP reviewed  Continue Concerta  27mg  daily  Monitor BP between visits while taking stimulant medication.   RTC 3 months - may follow up with new provider in [place].  [facility] [address] [place], [place] [phone]  25 minutes spent dedicated to the care of this patient on the date of this encounter to include pre-visit review of records, ordering of medication, post visit documentation, and face-to-face time with the patient discussing ADD. Discussed continuing current medication regimen.  Patient advised to contact office with any questions, adverse effects, or acute worsening in signs and symptoms.  Discussed potential benefits, risks, and side effects of stimulants with patient to include increased heart rate, palpitations, insomnia, increased anxiety, increased irritability, or decreased appetite.    Instructed patient to contact office if experiencing any significant tolerability issues.  There are no diagnoses linked to this encounter.   Please see After Visit Summary for patient specific instructions.  Future Appointments  Date Time Provider Department Center  12/30/2023 11:00 AM  Dontee Jaso Nattalie, NP [facility] None    No orders of the defined types were placed in this encounter.     -------------------------------

## 2024-02-13 DIAGNOSIS — L858 Other specified epidermal thickening: Secondary | ICD-10-CM | POA: Diagnosis not present

## 2024-02-13 DIAGNOSIS — L2089 Other atopic dermatitis: Secondary | ICD-10-CM | POA: Diagnosis not present

## 2024-02-13 DIAGNOSIS — L700 Acne vulgaris: Secondary | ICD-10-CM | POA: Diagnosis not present

## 2024-02-14 ENCOUNTER — Ambulatory Visit (INDEPENDENT_AMBULATORY_CARE_PROVIDER_SITE_OTHER): Payer: Self-pay | Admitting: Podiatry

## 2024-02-14 ENCOUNTER — Ambulatory Visit

## 2024-02-14 ENCOUNTER — Ambulatory Visit (INDEPENDENT_AMBULATORY_CARE_PROVIDER_SITE_OTHER)

## 2024-02-14 DIAGNOSIS — M84374A Stress fracture, right foot, initial encounter for fracture: Secondary | ICD-10-CM

## 2024-02-14 DIAGNOSIS — D3610 Benign neoplasm of peripheral nerves and autonomic nervous system, unspecified: Secondary | ICD-10-CM

## 2024-02-14 DIAGNOSIS — M79671 Pain in right foot: Secondary | ICD-10-CM

## 2024-02-14 DIAGNOSIS — M79672 Pain in left foot: Secondary | ICD-10-CM | POA: Diagnosis not present

## 2024-02-14 NOTE — Progress Notes (Signed)
 Subjective:   Patient ID: Nicole Todd, female   DOB: 43 y.o.   MRN: [id number]   HPI Chief Complaint  Patient presents with   Foot Pain    Right foot arch pain x 1 month and left foot neuroma. 4 pain. 75% better on right foot. Numbness on left IMS 2. Non diabetic. 0 treatment at home.   43 year old female presents the office with concerns of right arch, foot pain which has been on the for 1 month.  She states that she was in [place]  visiting family when she went to stand up and she had pain.  She is concerned about possible fracture.  She does not recall any injuries or changes in shoes or activity.  No recent treatment.  She also reports numbness to her second toe.  She does have a neuroma that was previously diagnosed several years ago and she since discontinued tight shoes and try to decrease the pressure.   Review of Systems  All other systems reviewed and are negative.       Objective:  Physical Exam  General: AAO x3, NAD  Dermatological: No open lesions identified.  Vascular: Dorsalis Pedis artery and Posterior Tibial artery pedal pulses are 2/4 bilateral with immedate capillary fill time. There is no pain with calf compression, swelling, warmth, erythema.   Neruologic: Grossly intact via light touch bilateral.   Musculoskeletal: On the right foot there is mild discomfort the arch of the foot as well as the plantar heel.  She does get some discomfort to the forefoot but no specific area of pinpoint tenderness identified at this time.  On the left foot not able to palpate a neuroma.  There is no tenderness to palpation.  No edema, erythema to the left side.  MMT 5/5 bilaterally.  Gait: Unassisted, Nonantalgic.       Assessment:   43 year old female with right foot concern for stress fracture; left foot neuroma     Plan:  -Treatment options discussed including all alternatives, risks, and complications -Etiology of symptoms were discussed -X-rays were obtained and  reviewed with the patient.  Multiple views bilateral feet were obtained.  Left foot is nones of acute fracture.  The right foot there is some questionable radiolucency along the second metatarsal. -For the right foot recommended immobilization in surgical shoe which was dispensed today help facilitate healing. -As symptoms improve she can start a gradual transition to regular shoe when she has no pain.  At the time recommended stretching, rehab exercises. -Regard to the left foot continue shoes to avoid excess pressure, offloading.  There is no significant pain only some numbness of the second toe which is localized without any radiation.  Continue to decrease the pressure.  Return in about 4 weeks (around 03/13/2024) for stress fracture right foot, x-ray.  Donnice JONELLE Fees DPM

## 2024-02-17 DIAGNOSIS — Z13220 Encounter for screening for lipoid disorders: Secondary | ICD-10-CM | POA: Diagnosis not present

## 2024-02-17 DIAGNOSIS — F900 Attention-deficit hyperactivity disorder, predominantly inattentive type: Secondary | ICD-10-CM | POA: Diagnosis not present

## 2024-02-17 DIAGNOSIS — K219 Gastro-esophageal reflux disease without esophagitis: Secondary | ICD-10-CM | POA: Diagnosis not present

## 2024-02-20 DIAGNOSIS — Z23 Encounter for immunization: Secondary | ICD-10-CM | POA: Diagnosis not present

## 2024-02-20 DIAGNOSIS — Z0000 Encounter for general adult medical examination without abnormal findings: Secondary | ICD-10-CM | POA: Diagnosis not present

## 2024-03-16 ENCOUNTER — Encounter: Payer: Self-pay | Admitting: Podiatry

## 2024-03-16 ENCOUNTER — Ambulatory Visit (INDEPENDENT_AMBULATORY_CARE_PROVIDER_SITE_OTHER)

## 2024-03-16 ENCOUNTER — Ambulatory Visit: Payer: Self-pay | Admitting: Podiatry

## 2024-03-16 DIAGNOSIS — M84374A Stress fracture, right foot, initial encounter for fracture: Secondary | ICD-10-CM | POA: Diagnosis not present

## 2024-03-16 DIAGNOSIS — M21619 Bunion of unspecified foot: Secondary | ICD-10-CM | POA: Diagnosis not present

## 2024-03-16 DIAGNOSIS — M84374D Stress fracture, right foot, subsequent encounter for fracture with routine healing: Secondary | ICD-10-CM | POA: Diagnosis not present

## 2024-03-16 NOTE — Progress Notes (Unsigned)
 Subjective:   Patient ID: Nicole Todd, female   DOB: 43 y.o.   MRN: [id number]   HPI Chief Complaint  Patient presents with   Foot Pain    Patient here for right foot stress fracture    43 year old female presents the office with concerns of right arch, foot pain which has been on the for 1 month.  States that she has been doing well.  She did not with a surgical shoe for longer than the lack of arch support.  She not having any significant discomfort at this time.  She states that random times will get a little tender but otherwise doing well.    She is concerned of her bunions.    No recent injuries or changes otherwise.         Objective:  Physical Exam  General: AAO x3, NAD  Dermatological: No open lesions identified.  Vascular: Dorsalis Pedis artery and Posterior Tibial artery pedal pulses are 2/4 bilateral with immedate capillary fill time. There is no pain with calf compression, swelling, warmth, erythema.   Neruologic: Grossly intact via light touch bilateral.   Musculoskeletal: In particular to the right foot there is no significant discomfort on the arch of the foot not able to appreciate any area of pinpoint tenderness.  There is no edema, erythema.  Mild bunion is present bilaterally.  There is no crepitation with MTPJ range of motion.  No erythema or warmth.  MMT 5/5.  No pain on the interspaces today.       Assessment:   43 year old female with right foot concern for stress fracture; left foot neuroma     Plan:  -Treatment options discussed including all alternatives, risks, and complications -Etiology of symptoms were discussed -X-rays obtained and reviewed.  Multiple views obtained.  There is no evidence of acute fracture.  Mild bunion is present. -At this time there is no evidence of acute fracture noted and should not be any pain.  Discussed continue shoes with good arch support.  We discussed etiology and progression of bunions.  For now continue  offloading pads or shoes that avoid pressure as well as good arch support.  In future can consider surgical intervention if needed.  Return if symptoms worsen or fail to improve.  Donnice JONELLE Fees DPM

## 2024-03-16 NOTE — Patient Instructions (Signed)

## 2024-03-23 ENCOUNTER — Telehealth: Admitting: Adult Health

## 2024-03-23 ENCOUNTER — Encounter: Payer: Self-pay | Admitting: Adult Health

## 2024-03-23 DIAGNOSIS — F900 Attention-deficit hyperactivity disorder, predominantly inattentive type: Secondary | ICD-10-CM | POA: Diagnosis not present

## 2024-03-23 MED ORDER — METHYLPHENIDATE HCL ER (OSM) 27 MG PO TBCR: 27.0000 mg | EXTENDED_RELEASE_TABLET | ORAL | 0 refills | Status: DC

## 2024-03-23 NOTE — Progress Notes (Signed)
 Nicole Todd [id number] 04/16/1981 43 y.o.  Virtual Visit via Video Note  I connected with pt @ on 03/23/24 at 10:00 AM EST by a video enabled telemedicine application and verified that I am speaking with the correct person using two identifiers.   I discussed the limitations of evaluation and management by telemedicine and the availability of in person appointments. The patient expressed understanding and agreed to proceed.  I discussed the assessment and treatment plan with the patient. The patient was provided an opportunity to ask questions and all were answered. The patient agreed with the plan and demonstrated an understanding of the instructions.   The patient was advised to call back or seek an in-person evaluation if the symptoms worsen or if the condition fails to improve as anticipated.  I provided 30 minutes of non-face-to-face time during this encounter.  The patient was located at home.  The provider was located at [facility].   Angeline LOISE Sayers, NP   Subjective:   Patient ID:  Nicole Todd is a 43 y.o. (DOB 31-Jul-1980) female.  Chief Complaint: No chief complaint on file.   HPI Nicole Todd presents for follow-up of ADHD.  Referred by PCP.  Describes mood today as ok. Pleasant. Denies tearfulness. Mood symptoms - denies depression, anxiety and irritability. Reports stable interest and motivation. Denies panic attacks. Denies worry, rumination and over thinking. Reports situational stressors. Reports mood is stable. Stating I feel like I'm doing ok given the circumstances. Feels like current medication regimen is helpful. Taking medications as prescribed. Energy levels stable. Active, has a regular exercise routine.  Enjoys some usual interests and activities. Divorced. Lives alone with 2 sons. Spending time with family. Appetite adequate. Weight loss 21 pounds over the past year.  Reports sleep has improved. Averages 6 to 8 hours - using 10mg  of Melatonin  at bedtime. Focus and concentration stable with Concerta . Completing tasks. Managing aspects of household. 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 Denies SI or HI.  Denies AH or VH. Denies self harm. Denies substance use.  Previous medication trials: Celexa  20mg  daily  Review of Systems:  Review of Systems  Musculoskeletal:  Negative for gait problem.  Neurological:  Negative for tremors.  Psychiatric/Behavioral:         Please refer to HPI   Medications: I have reviewed the patient's current medications.  Current Outpatient Medications  Medication Sig Dispense Refill   Calcium Aspartate 75 MG TABS Take by mouth.     calcium carbonate (TUMS - DOSED IN MG ELEMENTAL CALCIUM) 500 MG chewable tablet Chew 1 tablet by mouth.     Cholecalciferol (VITAMIN D3) 2000 UNITS capsule Take by mouth.     famotidine (PEPCID) 10 MG tablet Take by mouth.     methylphenidate  (CONCERTA ) 27 MG PO CR tablet Take 1 tablet (27 mg total) by mouth every morning. 30 tablet 0   methylphenidate  (CONCERTA ) 27 MG PO CR tablet Take 1 tablet (27 mg total) by mouth every morning. 30 tablet 0   spironolactone (ALDACTONE) 100 MG tablet TAKE 1 TABLET BY MOUTH EVERY DAY WITH FOOD. DO NOT USE IF PREGNANT     valACYclovir (VALTREX) 500 MG tablet Take by mouth.     No current facility-administered medications for this visit.    Medication Side Effects: None  Allergies:  Allergies  Allergen Reactions   Meperidine    Meperidine Hcl Other (See Comments)  Past Medical History:  Diagnosis Date   GERD (gastroesophageal reflux disease)     No family history on file.  Social History   Socioeconomic History   Marital status: Unknown    Spouse name: Not on file   Number of children: Not on file   Years of education: Not on file   Highest education level: Not on file  Occupational History   Not on file  Tobacco Use   Smoking status: Never   Smokeless  tobacco: Never  Substance and Sexual Activity   Alcohol use: No    Alcohol/week: 0.0 standard drinks of alcohol   Drug use: No   Sexual activity: Not on file  Other Topics Concern   Not on file  Social History Narrative   Not on file   Social Drivers of Health   Financial Resource Strain: Not on file  Food Insecurity: Not on file  Transportation Needs: Not on file  Physical Activity: Not on file  Stress: Not on file  Social Connections: Unknown (09/28/2021)   Received from [facility]   Social Network    Social Network: Not on file  Intimate Partner Violence: Unknown (08/23/2021)   Received from [facility]   HITS    Physically Hurt: Not on file    Insult or Talk Down To: Not on file    Threaten Physical Harm: Not on file    Scream or Curse: Not on file    Past Medical History, Surgical history, Social history, and Family history were reviewed and updated as appropriate.   Please see review of systems for further details on the patient's review from today.   Objective:   Physical Exam:  There were no vitals taken for this visit.  Physical Exam Constitutional:      General: She is not in acute distress. Musculoskeletal:        General: No deformity.  Neurological:     Mental Status: She is alert and oriented to person, place, and time.     Coordination: Coordination normal.  Psychiatric:        Attention and Perception: Attention and perception normal. She does not perceive auditory or visual hallucinations.        Mood and Affect: Mood normal. Mood is not anxious or depressed. Affect is not labile, blunt, angry or inappropriate.        Speech: Speech normal.        Behavior: Behavior normal.        Thought Content: Thought content normal. Thought content is not paranoid or delusional. Thought content does not include homicidal or suicidal ideation. Thought content does not include homicidal or suicidal plan.        Cognition and Memory: Cognition and memory  normal.        Judgment: Judgment normal.     Comments: Insight intact     Lab Review:  No results found for: NA, K, CL, CO2, GLUCOSE, BUN, CREATININE, CALCIUM, PROT, ALBUMIN, AST, ALT, ALKPHOS, BILITOT, GFRNONAA, GFRAA  No results found for: WBC, RBC, HGB, HCT, PLT, MCV, MCH, MCHC, RDW, LYMPHSABS, MONOABS, EOSABS, BASOSABS  No results found for: POCLITH, LITHIUM   No results found for: PHENYTOIN, PHENOBARB, VALPROATE, CBMZ   .res Assessment: Plan:    Plan:  PDMP reviewed  Continue Concerta  27mg  daily  Monitor BP between visits while taking stimulant medication.   RTC 3 months  30 minutes spent dedicated to the care of this patient on the date of this encounter  to include pre-visit review of records, ordering of medication, post visit documentation, and face-to-face time with the patient discussing ADD. Discussed continuing current medication regimen.  Patient advised to contact office with any questions, adverse effects, or acute worsening in signs and symptoms.  Discussed potential benefits, risks, and side effects of stimulants with patient to include increased heart rate, palpitations, insomnia, increased anxiety, increased irritability, or decreased appetite.    Instructed patient to contact office if experiencing any significant tolerability issues.   There are no diagnoses linked to this encounter.   Please see After Visit Summary for patient specific instructions.  Future Appointments  Date Time Provider Department Center  03/23/2024 10:00 AM Lerin Jech Nattalie, NP CP-CP None    No orders of the defined types were placed in this encounter.     -------------------------------

## 2024-06-23 ENCOUNTER — Telehealth: Admitting: Adult Health

## 2024-06-23 ENCOUNTER — Encounter: Payer: Self-pay | Admitting: Adult Health

## 2024-06-23 DIAGNOSIS — F900 Attention-deficit hyperactivity disorder, predominantly inattentive type: Secondary | ICD-10-CM

## 2024-06-23 MED ORDER — METHYLPHENIDATE HCL ER (OSM) 27 MG PO TBCR: 27.0000 mg | EXTENDED_RELEASE_TABLET | ORAL | 0 refills | Status: AC

## 2024-09-23 ENCOUNTER — Telehealth: Admitting: Adult Health
